# Patient Record
Sex: Male | Born: 1970 | Race: White | Hispanic: No | Marital: Married | Smoking: Never smoker
Health system: Southern US, Community
[De-identification: ages and names within clinical notes are randomized; demographics above are authoritative.]

## PROBLEM LIST (undated history)

## (undated) DIAGNOSIS — B019 Varicella without complication: Secondary | ICD-10-CM

## (undated) DIAGNOSIS — I447 Left bundle-branch block, unspecified: Secondary | ICD-10-CM

## (undated) DIAGNOSIS — T7840XA Allergy, unspecified, initial encounter: Secondary | ICD-10-CM

## (undated) DIAGNOSIS — R51 Headache: Secondary | ICD-10-CM

## (undated) DIAGNOSIS — M1990 Unspecified osteoarthritis, unspecified site: Secondary | ICD-10-CM

## (undated) DIAGNOSIS — I442 Atrioventricular block, complete: Secondary | ICD-10-CM

## (undated) DIAGNOSIS — E785 Hyperlipidemia, unspecified: Secondary | ICD-10-CM

## (undated) DIAGNOSIS — R519 Headache, unspecified: Secondary | ICD-10-CM

## (undated) HISTORY — DX: Atrioventricular block, complete: I44.2

## (undated) HISTORY — PX: WISDOM TOOTH EXTRACTION: SHX21

## (undated) HISTORY — DX: Allergy, unspecified, initial encounter: T78.40XA

## (undated) HISTORY — DX: Headache: R51

## (undated) HISTORY — DX: Unspecified osteoarthritis, unspecified site: M19.90

## (undated) HISTORY — DX: Headache, unspecified: R51.9

## (undated) HISTORY — DX: Varicella without complication: B01.9

## (undated) HISTORY — DX: Hyperlipidemia, unspecified: E78.5

---

## 2001-02-17 HISTORY — PX: WRIST SURGERY: SHX841

## 2001-11-19 ENCOUNTER — Ambulatory Visit (HOSPITAL_BASED_OUTPATIENT_CLINIC_OR_DEPARTMENT_OTHER): Admission: RE | Admit: 2001-11-19 | Discharge: 2001-11-19 | Payer: Self-pay | Admitting: Orthopedic Surgery

## 2010-03-07 ENCOUNTER — Other Ambulatory Visit: Payer: Self-pay | Admitting: Internal Medicine

## 2010-03-07 ENCOUNTER — Ambulatory Visit
Admission: RE | Admit: 2010-03-07 | Discharge: 2010-03-07 | Payer: Self-pay | Source: Home / Self Care | Attending: Internal Medicine | Admitting: Internal Medicine

## 2010-03-07 DIAGNOSIS — M2550 Pain in unspecified joint: Secondary | ICD-10-CM

## 2010-03-07 HISTORY — DX: Pain in unspecified joint: M25.50

## 2010-03-07 LAB — LIPID PANEL
Cholesterol: 170 mg/dL (ref 0–200)
HDL: 50.5 mg/dL (ref >39.00)
LDL Cholesterol: 105 mg/dL — ABNORMAL HIGH (ref 0–99)
Total CHOL/HDL Ratio: 3
Triglycerides: 73 mg/dL (ref 0.0–149.0)
VLDL: 14.6 mg/dL (ref 0.0–40.0)

## 2010-03-07 LAB — CBC WITH DIFFERENTIAL/PLATELET
Basophils Absolute: 0 10*3/uL (ref 0.0–0.1)
Basophils Relative: 0.3 % (ref 0.0–3.0)
Eosinophils Absolute: 0.1 10*3/uL (ref 0.0–0.7)
Eosinophils Relative: 2.1 % (ref 0.0–5.0)
HCT: 45 % (ref 39.0–52.0)
Hemoglobin: 15.4 g/dL (ref 13.0–17.0)
Lymphocytes Relative: 34.5 % (ref 12.0–46.0)
Lymphs Abs: 1.8 10*3/uL (ref 0.7–4.0)
MCHC: 34.4 g/dL (ref 30.0–36.0)
MCV: 97.9 fl (ref 78.0–100.0)
Monocytes Absolute: 0.4 10*3/uL (ref 0.1–1.0)
Monocytes Relative: 8.1 % (ref 3.0–12.0)
Neutro Abs: 2.8 10*3/uL (ref 1.4–7.7)
Neutrophils Relative %: 55 % (ref 43.0–77.0)
Platelets: 177 10*3/uL (ref 150.0–400.0)
RBC: 4.59 Mil/uL (ref 4.22–5.81)
RDW: 12.6 % (ref 11.5–14.6)
WBC: 5.1 10*3/uL (ref 4.5–10.5)

## 2010-03-07 LAB — CONVERTED CEMR LAB
Bilirubin Urine: NEGATIVE
Blood in Urine, dipstick: NEGATIVE
Glucose, Urine, Semiquant: NEGATIVE
Ketones, urine, test strip: NEGATIVE
Nitrite: NEGATIVE
Protein, U semiquant: NEGATIVE
Specific Gravity, Urine: 1.015
Urobilinogen, UA: 0.2
WBC Urine, dipstick: NEGATIVE
pH: 7.5

## 2010-03-07 LAB — BASIC METABOLIC PANEL
BUN: 13 mg/dL (ref 6–23)
CO2: 31 mEq/L (ref 19–32)
Calcium: 9.2 mg/dL (ref 8.4–10.5)
Chloride: 101 mEq/L (ref 96–112)
Creatinine, Ser: 0.9 mg/dL (ref 0.4–1.5)
GFR: 98.9 mL/min (ref >60.00)
Glucose, Bld: 79 mg/dL (ref 70–99)
Potassium: 4.4 mEq/L (ref 3.5–5.1)
Sodium: 137 mEq/L (ref 135–145)

## 2010-03-07 LAB — HEPATIC FUNCTION PANEL
ALT: 16 U/L (ref 0–53)
AST: 19 U/L (ref 0–37)
Albumin: 4.3 g/dL (ref 3.5–5.2)
Alkaline Phosphatase: 47 U/L (ref 39–117)
Bilirubin, Direct: 0.1 mg/dL (ref 0.0–0.3)
Total Bilirubin: 1.2 mg/dL (ref 0.3–1.2)
Total Protein: 7.1 g/dL (ref 6.0–8.3)

## 2010-03-07 LAB — TSH: TSH: 1.39 u[IU]/mL (ref 0.35–5.50)

## 2010-03-21 NOTE — Assessment & Plan Note (Signed)
Summary: BRAND NEW PT/TO EST/PT REQ CPX/COMING IN FASTING/CJR   Vital Signs:  Patient profile:   40 year old male Height:      69.5 inches Weight:      174 pounds BMI:     25.42 Pulse rate:   64 / minute BP sitting:   106 / 74  (left arm)  Vitals Entered By: Kyung Rudd, CMA (March 07, 2010 10:18 AM) CC: NP.Marland KitchenMarland Kitchenpt c/o pain in the arches of both feet that radiates up the leg into the calf since November   CC:  NP.Marland KitchenMarland Kitchenpt c/o pain in the arches of both feet that radiates up the leg into the calf since November.  History of Present Illness: Pt presents to clinic to est primary medical care, cpe and evaluation of chronic arthralgias. Notes several year h/o intermittent bilateral hip/leg pain, knee and shoulder pain without injury or trauma. Denies myalgias or inflammatory changes of joints. S/p rheum evaluation in the past with lab work reportedly unremarkable. Now recent h/o plantar foot pain anterior to calcaneal bone without injury or trauma. Attempted shoe insert without significant improvement.   Preventive Screening-Counseling & Management  Alcohol-Tobacco     Smoking Status: never     Smoking Cessation Counseling: no     Tobacco Counseling: not indicated; no tobacco use  Caffeine-Diet-Exercise     Does Patient Exercise: no      Drug Use:  no.    Problems Prior to Update: 1)  Arthralgia  (ICD-719.40) 2)  Physical Examination  (ICD-V70.0) 3)  Family History Diabetes 1st Degree Relative  (ICD-V18.0)  Past History:  Past medical, surgical, family and social histories (including risk factors) reviewed, and no changes noted (except as noted below).  Family History: Reviewed history and no changes required. Family History of Arthritis Family History Diabetes 1st degree relative Family History High cholesterol Family History Lung cancer Family History of Stroke M 1st degree relative <50 father died of Hodgkins at age 36  Social History: Reviewed history and no changes  required. Married Never Smoked Alcohol use-yes Drug use-no Regular exercise-no 1 childSmoking Status:  never Drug Use:  no Does Patient Exercise:  no  Review of Systems General:  Denies chills, fever, and weakness. Eyes:  Denies eye irritation, eye pain, and red eye. ENT:  Denies ear discharge, earache, and nosebleeds. CV:  Denies chest pain or discomfort, fatigue, near fainting, and shortness of breath with exertion. Resp:  Denies chest discomfort, cough, and shortness of breath. GI:  Denies abdominal pain, bloody stools, change in bowel habits, nausea, and vomiting. MS:  Complains of joint pain; denies joint redness, joint swelling, loss of strength, low back pain, muscle aches, cramps, muscle weakness, and stiffness. Derm:  Denies changes in color of skin, flushing, and rash. Neuro:  Denies headaches, memory loss, poor balance, and weakness. Endo:  Denies excessive thirst, excessive urination, and polyuria. Heme:  Denies abnormal bruising, bleeding, and fevers. Allergy:  Denies hives or rash, itching eyes, and persistent infections.  Physical Exam  General:  Well-developed,well-nourished,in no acute distress; alert,appropriate and cooperative throughout examination Head:  Normocephalic and atraumatic without obvious abnormalities. No apparent alopecia or balding. Eyes:  pupils equal, pupils round, corneas and lenses clear, and no injection.   Ears:  External ear exam shows no significant lesions or deformities.  Otoscopic examination reveals clear canals, tympanic membranes are intact bilaterally without bulging, retraction, inflammation or discharge. Hearing is grossly normal bilaterally. Nose:  External nasal examination shows no deformity or inflammation. Nasal mucosa  are pink and moist without lesions or exudates. Mouth:  Oral mucosa and oropharynx without lesions or exudates.  Teeth in good repair. Neck:  No deformities, masses, or tenderness noted. Lungs:  Normal respiratory  effort, chest expands symmetrically. Lungs are clear to auscultation, no crackles or wheezes. Heart:  Normal rate and regular rhythm. S1 and S2 normal without gallop, murmur, click, rub or other extra sounds. Abdomen:  Bowel sounds positive,abdomen soft and non-tender without masses, organomegaly or hernias noted. Msk:  normal ROM, no joint tenderness, no joint swelling, no joint warmth, no redness over joints, no joint deformities, and no crepitation.  FROM right hip. +pain with flexion and internal rotation. No click. +tenderness ant to left calcaneal bone. FROM ankle/achilles.  Extremities:  No clubbing, cyanosis, edema, or deformity noted with normal full range of motion of all joints.   Neurologic:  alert & oriented X3 and gait normal.   Skin:  turgor normal, color normal, no rashes, and no suspicious lesions.   Cervical Nodes:  No lymphadenopathy noted Psych:  Oriented X3, normally interactive, good eye contact, not anxious appearing, and not depressed appearing.     Impression & Recommendations:  Problem # 1:  PHYSICAL EXAMINATION (ICD-V70.0) Obtain screening labs. Tetanus booster given. Rheum consult second opinion of chronic arthralgias. Attempt dlclofenac as needed with food for possible plantar fasciitis. Followup if no improvement or worsening.  Orders: UA Dipstick w/o Micro (automated)  (81003) Venipuncture (47829) Specimen Handling (56213) TLB-BMP (Basic Metabolic Panel-BMET) (80048-METABOL) TLB-CBC Platelet - w/Differential (85025-CBCD) TLB-Hepatic/Liver Function Pnl (80076-HEPATIC) TLB-TSH (Thyroid Stimulating Hormone) (84443-TSH) TLB-Lipid Panel (80061-LIPID)  Complete Medication List: 1)  Diclofenac Sodium 75 Mg Tbec (Diclofenac sodium) .... One by mouth two times a day x 7days then one by mouth two times a day as needed pain (take with food)  Other Orders: Tdap => 66yrs IM (08657) Admin 1st Vaccine (84696) Rheumatology Referral (Rheumatology)  Patient  Instructions: 1)  Please schedule a follow-up appointment in 1 year for a physical. 2)  BMP prior to visit, ICD-9:v70.0 3)  Hepatic Panel prior to visit, ICD-9:v70.0 4)  Lipid Panel prior to visit, ICD-9:v70.0 5)  TSH prior to visit, ICD-9:v70.0 6)  CBC w/ Diff prior to visit, ICD-9:v70.0 7)  Urine-dip prior to visit, ICD-9:v70.0 Prescriptions: DICLOFENAC SODIUM 75 MG TBEC (DICLOFENAC SODIUM) one by mouth two times a day x 7days then one by mouth two times a day as needed pain (take with food)  #30 x 0   Entered and Authorized by:   Edwyna Perfect MD   Signed by:   Edwyna Perfect MD on 03/07/2010   Method used:   Print then Give to Patient   RxID:   [id number]    Orders Added: 1)  UA Dipstick w/o Micro (automated)  [81003] 2)  Venipuncture [25366] 3)  Specimen Handling [99000] 4)  Tdap => 56yrs IM [90715] 5)  Admin 1st Vaccine [90471] 6)  TLB-BMP (Basic Metabolic Panel-BMET) [80048-METABOL] 7)  TLB-CBC Platelet - w/Differential [85025-CBCD] 8)  TLB-Hepatic/Liver Function Pnl [80076-HEPATIC] 9)  TLB-TSH (Thyroid Stimulating Hormone) [84443-TSH] 10)  TLB-Lipid Panel [80061-LIPID] 11)  Rheumatology Referral [Rheumatology] 12)  New Patient 18-39 years [99385]   Immunizations Administered:  Tetanus Vaccine:    Vaccine Type: Tdap    Site: left deltoid    Mfr: GlaxoSmithKline    Dose: 0.5 ml    Route: IM    Given by: Kyung Rudd, CMA    Exp. Date: 12/07/2011    Lot #: [id number]  Immunizations Administered:  Tetanus Vaccine:    Vaccine Type: Tdap    Site: left deltoid    Mfr: GlaxoSmithKline    Dose: 0.5 ml    Route: IM    Given by: Kyung Rudd, CMA    Exp. Date: 12/07/2011    Lot #: [id number]  Laboratory Results   Urine Tests  Date/Time Recieved: March 07, 2010 1:11 PM  Date/Time Reported: March 07, 2010 1:11 PM   Routine Urinalysis   Color: yellow Appearance: Clear Glucose: negative   (Normal Range: Negative) Bilirubin: negative    (Normal Range: Negative) Ketone: negative   (Normal Range: Negative) Spec. Gravity: 1.015   (Normal Range: 1.003-1.035) Blood: negative   (Normal Range: Negative) pH: 7.5   (Normal Range: 5.0-8.0) Protein: negative   (Normal Range: Negative) Urobilinogen: 0.2   (Normal Range: 0-1) Nitrite: negative   (Normal Range: Negative) Leukocyte Esterace: negative   (Normal Range: Negative)    Comments: Wynona Canes, CMA  March 07, 2010 1:11 PM

## 2010-07-05 NOTE — Op Note (Signed)
NAME:  Joel Lynch, Joel Lynch                         ACCOUNT NO.:  [id number]   MEDICAL RECORD NO.:  [id number]                   PATIENT TYPE:  AMB   LOCATION:  [facility]                                  FACILITY:  [facility]   PHYSICIAN:  Cindee Salt, MD                      DATE OF BIRTH:  [birth year]   DATE OF PROCEDURE:  DATE OF DISCHARGE:                                 OPERATIVE REPORT   PREOPERATIVE DIAGNOSES:  1. Fractured fourth metacarpal, left hand.  2. Distal radius fracture, left wrist.   POSTOPERATIVE DIAGNOSES:  1. Fractured fourth metacarpal, left hand.  2. Distal radius fracture, left wrist.   OPERATION:  Open reduction, internal fixation fourth metacarpal fracture  left hand.   SURGEON:  Cindee Salt, M.D.   FIRST ASSISTANT:  ___________   ANESTHESIA:  General.   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  He has  obvious displacement of the fracture of his finger with angulation and  rotation.  The fracture of his wrist is nondisplaced. He is admitted now for  open reduction and internal fixation of the metacarpal fracture.   PROCEDURE:  The patient was brought to the operating room where a general  anesthetic was carried out without difficulty.  He was prepped and draped  using Betadine scrubbing solution.  The left arm free.  Supine position.  The limb was exsanguinated with an Esmarch bandage.  The tourniquet was  placed high and the arm was inflated to 250 mmHg.  Straight incision was  made over the fourth metacarpal and carried down through subcutaneous  tissue.  Bleeders were electrocauterized.  Neurovascular structures  protected.  Dissection carried down splitting the extensor tendon which had  multiple slips.  The fracture was extremely distal long oblique in nature  terminating just under the articular surface on the ulnar aspect.  With  a  moderate amount of difficulty due the spiral nature of the fracture, it was  able to be reduced, clamped.  X-rays confirmed anatomical alignment.  Four  screws were then inserted.  These were 1.5 mm screws measuring 10, 10, 10,  and 14 mm.  Each was drilled and measured, tapped, overdrilled proximally,  countersunk and placed.  This firmly fixed the fracture is position.  AP,  lateral and oblique x-rays revealed that the screws were off good length  with the fracture anatomically reduced.  The wound was copiously irrigated  with saline.  The periosteum closed with a running 6-0 Chromic.  The  extensor tendon with a running 5-0 Mersilene.  The subcutaneous tissue with  interrupted 4-0 Vicryl.  The skin with a subcuticular 4-0 Monocryl suture.  X-rays of the wrist confirmed no change in position.  Sterile compressive  dressing  and splint was applied after Steri-  Strips. The patient tolerated the procedure well and was taken to the  recovery room for observation in satisfactory condition.  He is discharged  home to return to the [facility] in one week, on Talwin NX  and Keflex.                                               Cindee Salt, MD    GK/MEDQ  D:  11/19/2001  T:  11/22/2001  Job:  [id number]

## 2010-08-02 ENCOUNTER — Other Ambulatory Visit: Payer: Self-pay | Admitting: Internal Medicine

## 2010-08-02 DIAGNOSIS — M25552 Pain in left hip: Secondary | ICD-10-CM

## 2010-08-07 ENCOUNTER — Ambulatory Visit
Admission: RE | Admit: 2010-08-07 | Discharge: 2010-08-07 | Disposition: A | Discharge status: Home / Self Care | Payer: BC Managed Care – PPO | Source: Ambulatory Visit | Attending: Internal Medicine | Admitting: Internal Medicine

## 2010-08-07 DIAGNOSIS — M25551 Pain in right hip: Secondary | ICD-10-CM

## 2012-05-05 ENCOUNTER — Telehealth: Payer: Self-pay | Admitting: Family Medicine

## 2012-05-05 NOTE — Telephone Encounter (Signed)
yes

## 2012-05-05 NOTE — Telephone Encounter (Signed)
appt set/kh 

## 2012-05-05 NOTE — Telephone Encounter (Signed)
Decided not to wait for Dr Tawanna Cooler to return/kh

## 2012-05-05 NOTE — Telephone Encounter (Signed)
Pt used to see Dr Rodena Medin at this location. Has not been seen since 2012. Pt wife sees Dr Tawanna Cooler.  Would like to continue to come to [place].   Insurance: [facility] Pt has had some discomfort in his chest from time to time. Would like to get checked out as soon as there is an opening.  Will you accept as a new patient?

## 2012-05-10 ENCOUNTER — Ambulatory Visit (INDEPENDENT_AMBULATORY_CARE_PROVIDER_SITE_OTHER): Payer: BC Managed Care – PPO | Admitting: Family Medicine

## 2012-05-10 ENCOUNTER — Encounter: Payer: Self-pay | Admitting: Family Medicine

## 2012-05-10 VITALS — BP 100/80 | HR 80 | Temp 98.2°F | Resp 12 | Ht 69.75 in | Wt 185.0 lb

## 2012-05-10 DIAGNOSIS — R079 Chest pain, unspecified: Secondary | ICD-10-CM

## 2012-05-10 LAB — BASIC METABOLIC PANEL
BUN: 14 mg/dL (ref 6–23)
Creatinine, Ser: 0.9 mg/dL (ref 0.4–1.5)
GFR: 97.8 mL/min (ref >60.00)
Glucose, Bld: 85 mg/dL (ref 70–99)
Potassium: 3.7 mEq/L (ref 3.5–5.1)

## 2012-05-10 LAB — LIPID PANEL
Cholesterol: 193 mg/dL (ref 0–200)
HDL: 50.4 mg/dL (ref >39.00)
Triglycerides: 86 mg/dL (ref 0.0–149.0)
VLDL: 17.2 mg/dL (ref 0.0–40.0)

## 2012-05-10 NOTE — Patient Instructions (Addendum)
We will call you regarding stress test. 

## 2012-05-10 NOTE — Progress Notes (Signed)
  Subjective:    Patient ID: Joel Lynch, male    DOB: [birth year], 42 y.o.   MRN: [id number]  HPI Patient seen to reestablish care He has history of apparently early osteoarthritis (mostly hips) and has seen rheumatologist previously. Migraine headaches which are infrequent. No other chronic medical problems.  Seen today with acute concerns one to 2 month history of some chest discomfort. Nonexertional. Location is left substernal. Quality is dull ache with no consistent pattern of radiation. Possible mild left arm pains intermittently Severity is mild. Denies associated dyspnea, nausea, or diaphoresis. Does not exercise consistently. Symptoms have never been exertional. No clear exacerbating factors. No alleviating factors. No history of GERD. Took Tagamet without relief  Denies any recent cough or pleuritic pain. No fever. No appetite or weight changes.  No family history of premature CAD. Patient nonsmoker. No history of diabetes or hypertension.   Review of Systems  Constitutional: Negative for fever, chills, appetite change and unexpected weight change.  HENT: Negative for trouble swallowing.   Respiratory: Negative for cough, shortness of breath and wheezing.   Cardiovascular: Positive for chest pain. Negative for palpitations and leg swelling.  Gastrointestinal: Negative for nausea, vomiting and abdominal pain.  Neurological: Negative for dizziness and syncope.  Hematological: Negative for adenopathy. Does not bruise/bleed easily.       Objective:   Physical Exam  Constitutional: He appears well-developed and well-nourished.  Neck: Neck supple. No thyromegaly present.  Cardiovascular: Normal rate and regular rhythm.   No murmur heard. Pulmonary/Chest: Effort normal and breath sounds normal. No respiratory distress. He has no wheezes. He has no rales.  Chest wall is nontender  Abdominal: Soft. He exhibits no distension and no mass. There is no tenderness. There is  no rebound and no guarding.  Musculoskeletal: He exhibits no edema.  Lymphadenopathy:    He has no cervical adenopathy.          Assessment & Plan:  Chest pain. Unclear etiology. Has never had exertional chest pain but does not do any regular exercise. Overall appears to be fairly low risk for CAD. Lipid status is unknown. Check EKG. Check lipids. Consider exercise stress test to further evaluate.

## 2012-05-11 NOTE — Progress Notes (Signed)
Quick Note:  Attempted to call pt and no vm set up. ______

## 2012-05-12 NOTE — Progress Notes (Signed)
Quick Note:  Labs mailed to pt home, asked him to contact our office to update best phone number ______

## 2012-05-27 ENCOUNTER — Ambulatory Visit (INDEPENDENT_AMBULATORY_CARE_PROVIDER_SITE_OTHER): Payer: BC Managed Care – PPO | Admitting: Physician Assistant

## 2012-05-27 DIAGNOSIS — R079 Chest pain, unspecified: Secondary | ICD-10-CM

## 2012-05-27 NOTE — Procedures (Signed)
Exercise Treadmill Test  Pre-Exercise Testing Evaluation Rhythm: normal sinus  Rate: 84     Test  Exercise Tolerance Test Ordering MD: Evelena Peat MD  Interpreting MD: Tereso Newcomer PA-C  Unique Test No: 1  Treadmill:  1  Indication for ETT: chest pain - rule out ischemia  Contraindication to ETT: No   Stress Modality: exercise - treadmill  Cardiac Imaging Performed: non   Protocol: standard Bruce - maximal  Max BP:  138/61  Max MPHR (bpm):  180 85% MPR (bpm):  153  MPHR obtained (bpm):  181 % MPHR obtained:  101  Reached 85% MPHR (min:sec):   6:42 Total Exercise Time (min-sec):  10:01  Workload in METS:  11.4 Borg Scale: 14  Reason ETT Terminated:  desired heart rate attained    ST Segment Analysis At Rest: normal ST segments - no evidence of significant ST depression With Exercise: no evidence of significant ST depression  Other Information Arrhythmia:  No Angina during ETT:  absent (0) Quality of ETT:  diagnostic  ETT Interpretation:  normal - no evidence of ischemia by ST analysis  Comments: Good exercise tolerance. No chest pain. Equivocal BP response to exercise. No ST-T changes to suggest ischemia.   Recommendations: F/u with Kristian Covey, MD as directed. Luna Glasgow, PA-C  12:23 PM 05/27/2012

## 2012-05-28 NOTE — Progress Notes (Signed)
Quick Note:  Pt informed ______ 

## 2016-04-09 ENCOUNTER — Encounter: Payer: Self-pay | Admitting: Family Medicine

## 2016-04-09 ENCOUNTER — Ambulatory Visit (INDEPENDENT_AMBULATORY_CARE_PROVIDER_SITE_OTHER)
Admission: RE | Admit: 2016-04-09 | Discharge: 2016-04-09 | Disposition: A | Discharge status: Home / Self Care | Payer: BC Managed Care – PPO | Source: Ambulatory Visit | Attending: Family Medicine | Admitting: Family Medicine

## 2016-04-09 ENCOUNTER — Ambulatory Visit (INDEPENDENT_AMBULATORY_CARE_PROVIDER_SITE_OTHER): Payer: BC Managed Care – PPO | Admitting: Family Medicine

## 2016-04-09 VITALS — BP 120/80 | HR 73 | Resp 12 | Ht 69.75 in | Wt 199.2 lb

## 2016-04-09 DIAGNOSIS — M549 Dorsalgia, unspecified: Secondary | ICD-10-CM

## 2016-04-09 DIAGNOSIS — Z0000 Encounter for general adult medical examination without abnormal findings: Secondary | ICD-10-CM

## 2016-04-09 LAB — BASIC METABOLIC PANEL
BUN: 13 mg/dL (ref 6–23)
CO2: 27 mEq/L (ref 19–32)
Calcium: 9.2 mg/dL (ref 8.4–10.5)
Chloride: 105 mEq/L (ref 96–112)
Creatinine, Ser: 1.02 mg/dL (ref 0.40–1.50)
GFR: 84.15 mL/min (ref >60.00)
Glucose, Bld: 81 mg/dL (ref 70–99)
POTASSIUM: 4.4 meq/L (ref 3.5–5.1)
SODIUM: 137 meq/L (ref 135–145)

## 2016-04-09 LAB — CBC
HCT: 45.1 % (ref 39.0–52.0)
Hemoglobin: 15.6 g/dL (ref 13.0–17.0)
MCHC: 34.5 g/dL (ref 30.0–36.0)
MCV: 97.2 fl (ref 78.0–100.0)
PLATELETS: 213 10*3/uL (ref 150.0–400.0)
RBC: 4.64 Mil/uL (ref 4.22–5.81)
RDW: 12.7 % (ref 11.5–15.5)
WBC: 5.2 10*3/uL (ref 4.0–10.5)

## 2016-04-09 MED ORDER — CYCLOBENZAPRINE HCL 10 MG PO TABS: 10.0000 mg | ORAL_TABLET | Freq: Three times a day (TID) | ORAL | 0 refills | Status: AC | PRN

## 2016-04-09 NOTE — Progress Notes (Signed)
Pre visit review using our clinic review tool, if applicable. No additional management support is needed unless otherwise documented below in the visit note. 

## 2016-04-09 NOTE — Patient Instructions (Signed)
A few things to remember from today's visit:   Mid-back pain, acute - Plan: cyclobenzaprine (FLEXERIL) 10 MG tablet, Ambulatory referral to Sports Medicine, CBC, Basic metabolic panel    Back pain is very common in adults.The cause of back pain is rarely dangerous and the pain often gets better over time even with no pharmacologic treatment.  The cause of your back pain may not be known. Some common causes of back pain include: 1. Strain of the muscles or ligaments supporting the spine. 2. Wear and tear (degeneration) of the spinal disks. 3. Arthritis. 4. Direct injury to the back.  For many people, back pain may return. Since back pain is rarely dangerous, most people can learn to manage this condition on their own.  HOME CARE INSTRUCTIONS Watch your back pain for any changes. The following actions may help to lessen any discomfort you are feeling:  1. Remain active. It is stressful on your back to sit or stand in one place for long periods of time. Do not sit, drive, or stand in one place for more than 30 minutes at a time. Take short walks on even surfaces as soon as you are able.Try to increase the length of time you walk each day.  2. Exercise regularly as directed by your health care provider. Exercise helps your back heal faster. It also helps avoid future injury by keeping your muscles strong and flexible.  3. Do not stay in bed.Resting more than 1-2 days can delay your recovery.                                                      4. Pay attention to your body when you bend and lift. The most comfortable positions are those that put less stress on your recovering back.  5.  Always use proper lifting techniques, including: Bending your knees. Keeping the load close to your body. Avoiding twisting.  6. Find a comfortable position to sleep. Use a firm mattress and lie on your side with your knees slightly bent. If you lie on your back, put a pillow under your knees.  7.  Over the counter rubbing medications like Icy Hot or Asper cream with Lidocaine may help without significant side effects.  Acetaminophen and/or Aleve/Ibuprofen can be taken if needed and if not contraindications. Local ice and heat may be alternated to reduce pain and spasms. Also massage and even chiropractor treatment.      Muscle relaxants might or might not help, they cause drowsiness among other    side effects. They could also interact with some of medications you may be already taking (medications for depression/anxiety and some pain medications).   8. Maintain a healthy weight. Excess weight puts extra stress on your back and makes it difficult to maintain good posture.   SEEK MEDICAL CARE IF: worsening pain, associated fever, rash/edema on area, pain going to legs or buttocks, numbness/tingling, night pain, or abnormal weight loss.    SEEK IMMEDIATE MEDICAL CARE IF:  1. You develop new bowel or bladder control problems. 2. You have unusual weakness or numbness in your arms or legs. 3. You develop nausea or vomiting. 4. You develop abdominal pain. 5. You feel faint.     Back Exercises The following exercises strengthen the muscles that help to support the back. They also  help to keep the lower back flexible. Doing these exercises can help to prevent back pain or lessen existing pain. If you have back pain or discomfort, try doing these exercises 2-3 times each day or as told by your health care provider. When the pain goes away, do them once each day, but increase the number of times that you repeat the steps for each exercise (do more repetitions). If you do not have back pain or discomfort, do these exercises once each day or as told by your health care provider.   EXERCISES Single Knee to Chest Repeat these steps 3-5 times for each leg: 5. Lie on your back on a firm bed or the floor with your legs extended. 6. Bring one knee to your chest. Your other leg should stay extended  and in contact with the floor. 7. Hold your knee in place by grabbing your knee or thigh. 8. Pull on your knee until you feel a gentle stretch in your lower back. 9. Hold the stretch for 10-30 seconds. 10. Slowly release and straighten your leg.  Pelvic Tilt Repeat these steps 5-10 times: 2. Lie on your back on a firm bed or the floor with your legs extended. 3. Bend your knees so they are pointing toward the ceiling and your feet are flat on the floor. 4. Tighten your lower abdominal muscles to press your lower back against the floor. This motion will tilt your pelvis so your tailbone points up toward the ceiling instead of pointing to your feet or the floor. 5. With gentle tension and even breathing, hold this position for 5-10 seconds.  Cat-Cow Repeat these steps until your lower back becomes more flexible: 1. Get into a hands-and-knees position on a firm surface. Keep your hands under your shoulders, and keep your knees under your hips. You may place padding under your knees for comfort. 2. Let your head hang down, and point your tailbone toward the floor so your lower back becomes rounded like the back of a cat. 3. Hold this position for 5 seconds. 4. Slowly lift your head and point your tailbone up toward the ceiling so your back forms a sagging arch like the back of a cow. 5. Hold this position for 5 seconds.   Press-Ups Repeat these steps 5-10 times: 6. Lie on your abdomen (face-down) on the floor. 7. Place your palms near your head, about shoulder-width apart. 8. While you keep your back as relaxed as possible and keep your hips on the floor, slowly straighten your arms to raise the top half of your body and lift your shoulders. Do not use your back muscles to raise your upper torso. You may adjust the placement of your hands to make yourself more comfortable. 9. Hold this position for 5 seconds while you keep your back relaxed. 10. Slowly return to lying flat on the  floor.   Bridges Repeat these steps 10 times: 1. Lie on your back on a firm surface. 2. Bend your knees so they are pointing toward the ceiling and your feet are flat on the floor. 3. Tighten your buttocks muscles and lift your buttocks off of the floor until your waist is at almost the same height as your knees. You should feel the muscles working in your buttocks and the back of your thighs. If you do not feel these muscles, slide your feet 1-2 inches farther away from your buttocks. 4. Hold this position for 3-5 seconds. 5. Slowly lower your hips to  the starting position, and allow your buttocks muscles to relax completely. If this exercise is too easy, try doing it with your arms crossed over your chest.     Please be sure medication list is accurate. If a new problem present, please set up appointment sooner than planned today.

## 2016-04-09 NOTE — Progress Notes (Signed)
HPI:   [name] is a 46 y.o. male, who is here today c/o persistent mid back pain.  Former pt of Dr Joel Lynch. According to pt, he was evaluated in a local acute care facility on 03/29/16 and Rx for Diclofenac and Methocarbamol were given. He denies any side effects from these medications but have not helped with pain at all.   States that about 2 weeks ago started with sudden"really bad" back pain.  He denies any recent injury or unusual level of activity.  Pain is not radiated, cannot explain type of pain, 8-9/10 in intensity, it "comes in waves" but has some pain constantly. He denies associated numbness, tingling, urinary incontinence or retention, stool incontinence, or saddle anesthesia. Pain causes nausea.  Exacerbated by prolonged sitting, also when lying down, keeps him from sleep. Alleviated some by stretching and changing position.  No rash or edema on area, fever, chills, or abnormal wt loss.  Prior Hx of back pain: Denies. In 2012 he had bilateral hip MRI because persistent pain, otherwise negative.  OTC medications: Aleve,Ibuprofen 800 mg every hour. He is also taking Rx Diclofenac AC 75 mg bid. Today he has taken all these medications because not improvement.    Review of Systems  Constitutional: Positive for fatigue. Negative for chills, fever and unexpected weight change.  HENT: Negative for mouth sores, nosebleeds, sore throat and trouble swallowing.   Respiratory: Negative for cough, shortness of breath and wheezing.   Gastrointestinal: Negative for abdominal pain, nausea and vomiting.  Endocrine: Negative for polydipsia, polyphagia and polyuria.  Genitourinary: Negative for decreased urine volume, difficulty urinating, dysuria, hematuria and urgency.  Musculoskeletal: Positive for back pain. Negative for arthralgias, gait problem and neck pain.  Skin: Negative for color change and rash.  Neurological: Negative for syncope, weakness, numbness  and headaches.  Hematological: Negative for adenopathy. Does not bruise/bleed easily.  Psychiatric/Behavioral: Positive for sleep disturbance (due to pain). Negative for confusion. The patient is nervous/anxious.       No current outpatient prescriptions on file prior to visit.   No current facility-administered medications on file prior to visit.      Past Medical History:  Diagnosis Date  . Allergy   . Arthritis   . Chicken pox   . Headache(784.0)    No Known Allergies  Family History  Problem Relation Age of Onset  . Hyperlipidemia Mother   . Arthritis Father   . Cancer Father 23    hodgkins  . Stroke Maternal Grandmother   . Arthritis Paternal Grandmother   . Hypertension Paternal Grandmother   . Heart disease Paternal Grandmother     Social History   Social History  . Marital status: Married    Spouse name: N/A  . Number of children: N/A  . Years of education: N/A   Social History Main Topics  . Smoking status: Never Smoker  . Smokeless tobacco: Never Used  . Alcohol use None  . Drug use: Unknown  . Sexual activity: Not Asked   Other Topics Concern  . None   Social History Narrative  . None    Vitals:   04/09/16 0754  BP: 120/80  Pulse: 73  Resp: 12   O2 sat 96% at RA. Body mass index is 28.79 kg/m.   Physical Exam  Nursing note and vitals reviewed. Constitutional: He is oriented to person, place, and time. He appears well-developed. No distress.  HENT:  Head: Atraumatic.  Mouth/Throat: Oropharynx is clear  and moist and mucous membranes are normal.  Eyes: Conjunctivae and EOM are normal.  Cardiovascular: Normal rate and regular rhythm.   No murmur heard. Pulses:      Dorsalis pedis pulses are 2+ on the right side, and 2+ on the left side.  Respiratory: Effort normal and breath sounds normal. No respiratory distress.  GI: Soft. He exhibits no mass. There is no hepatomegaly. There is no tenderness.  Musculoskeletal: He exhibits no  edema.       Cervical back: He exhibits no bony tenderness.       Thoracic back: He exhibits no bony tenderness.       Lumbar back: He exhibits no tenderness and no bony tenderness.  Mild pain upon palpation of right thoracic paraspinal muscle, lower interscapular around T6-.T10. No tenderness upon palpation of cervical or lumbar muscles, no limitation of ROM. No bony tenderness or significant deformity appreciated.  Lymphadenopathy:    He has no cervical adenopathy.       Right: No supraclavicular adenopathy present.       Left: No supraclavicular adenopathy present.  Neurological: He is alert and oriented to person, place, and time. He has normal strength. He displays tremor (noted minimal hand tremor with intension when holding med bottles.). No cranial nerve deficit. Coordination and gait normal.  Skin: Skin is warm. No rash noted. No erythema.  Psychiatric: His mood appears anxious. His affect is blunt.  Poor groomed, poor eye contact.      ASSESSMENT AND PLAN:   Joel Lynch was seen today for establish care.  Diagnoses and all orders for this visit:  Mid-back pain, acute  Explained that pain is most likely muscular and examination does not suggest a serious process. Very anxious and concerned about pain, tried to reassured. Because level of pain he is reporting plain imaging was recommended to evaluate for lytic or acute process. Also lab work was done. NSAID's side effects discussed, recommend with continuing with just one of all 3 NSAID's he is taking and take it as recommended. Stop Methocarbamol and try Flexeril instead. Some side effects discussed. Recommend OTC Lidocaine patches, local heat, and stretching exercises as well as avoiding prolonged sitting. Referral to sport med also placed. Instructed about warning signs.   -     cyclobenzaprine (FLEXERIL) 10 MG tablet; Take 1 tablet (10 mg total) by mouth 3 (three) times daily as needed for muscle spasms. -     Ambulatory  referral to Sports Medicine -     CBC -     Basic metabolic panel -     DG Thoracic Spine 2 View; Future  Healthcare maintenance  Otherwise healthy. Tdap up to date. He can continue following annually.      [name]. Martinique, MD  [facility]. [place] office.

## 2016-04-24 ENCOUNTER — Encounter: Payer: Self-pay | Admitting: Sports Medicine

## 2016-04-24 ENCOUNTER — Ambulatory Visit (INDEPENDENT_AMBULATORY_CARE_PROVIDER_SITE_OTHER): Payer: BC Managed Care – PPO | Admitting: Sports Medicine

## 2016-04-24 DIAGNOSIS — M549 Dorsalgia, unspecified: Secondary | ICD-10-CM | POA: Insufficient documentation

## 2016-04-24 HISTORY — DX: Dorsalgia, unspecified: M54.9

## 2016-04-24 NOTE — Assessment & Plan Note (Signed)
Suspect this is a flare from his mild arthritis. Continue over-the-counter anti-inflammatories and Flexeril at night. Gave exercise program for scapular stabilization.  Reviewed posture exercises. Believe this is a mechanical problem caused by his kyphosis and mild arthritis in the back. Recommend follow up if this is not improved and we'll consider physical therapy. Do not believe an MRI is warranted at this time. Could also consider prednisone in the future if this flares up again.

## 2016-04-24 NOTE — Progress Notes (Signed)
  Joel Lynch - 46 y.o. male MRN [id number]  Date of birth: 05-27-1970  SUBJECTIVE:  Including CC & ROS.  CC: mid back pain  Presents with one-month history of mid back pain. He reports an acute onset and it would come in waves of tense pain and less pain. He was seen at urgent care and given diclofenac methocarbamol. These medications did not help and he was taking NSAIDs every hour. He then follows PCP who recommended not taking NSAIDs every hour and recommended doing diclofenac and Flexeril instead. He has noted improvement since doing that. He has been trying to stretch at home. This has helped. It is exacerbated by prolonged sitting and also wakes him up from sleep. Flexeril does help with this. Denies any numbness or tingling, weakness, bowel or bladder incontinence. 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   ROS: No unexpected weight loss, fever, chills, swelling, instability, muscle pain, numbness/tingling, redness, otherwise see HPI   PMHx - Updated and reviewed.  Contributory factors include: Negative PSHx - Updated and reviewed.  Contributory factors include:  Negative FHx - Updated and reviewed.  Contributory factors include:  Negative Social Hx - Updated and reviewed. 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  PHYSICAL EXAM:  VS: BP:(!) 121/91  HR: bpm  TEMP: ( )  RESP:   HT:5\' 9"  (175.3 cm)   WT:199 lb (90.3 kg)  BMI:29.4 PHYSICAL EXAM: Gen: NAD, alert, cooperative with exam, well-appearing HEENT: clear conjunctiva,  CV:  no edema, capillary refill brisk, normal rate Resp: non-labored Skin: no rashes, normal turgor  Neuro: no gross deficits.  Psych:  alert and oriented  Back Exam:  Inspection: Unremarkable  Palpable tenderness: None. Range of Motion:  Flexion 45 deg; Extension 45 deg;  Side Bending to 45 deg bilaterally; Rotation to 45 deg bilaterally  Leg strength: Quad: 5/5 Hamstring: 5/5 Hip flexor: 5/5 Hip abductors: 5/5  Strength at foot: Plantar-flexion: 5/5 Dorsi-flexion: 5/5 Eversion: 5/5 Inversion: 5/5  Sensory change: Gross sensation intact to all lumbar and sacral dermatomes.  Reflexes: 2+ at both patellar tendons, 2+ at achilles tendons, Babinski's downgoing.  Gait unremarkable. SLR sitting: Negative  XSLR sitting: Negative  FABER: negative.    ASSESSMENT & PLAN:   Non-traumatic mid back pain Suspect this is a flare from his mild arthritis. Continue over-the-counter anti-inflammatories and Flexeril at night. Gave exercise program for scapular stabilization.  Reviewed posture exercises. Believe this is a mechanical problem caused by his kyphosis and mild arthritis in the back. Recommend follow up if this is not improved and we'll consider physical therapy. Do not believe an MRI is warranted at this time. Could also consider prednisone in the future if this flares up again.

## 2016-11-06 ENCOUNTER — Encounter: Payer: Self-pay | Admitting: Family Medicine

## 2016-11-20 LAB — HEPATIC FUNCTION PANEL
ALK PHOS: 42 (ref 25–125)
ALT: 36 (ref 10–40)
AST: 30 (ref 14–40)
Bilirubin, Total: 0.3

## 2016-11-20 LAB — BASIC METABOLIC PANEL
BUN: 17 (ref 4–21)
CREATININE: 0.9 (ref 0.6–1.3)
Glucose: 112
Potassium: 4.3 (ref 3.4–5.3)
SODIUM: 139 (ref 137–147)

## 2016-11-20 LAB — CBC AND DIFFERENTIAL
HCT: 44 (ref 41–53)
Hemoglobin: 15.3 (ref 13.5–17.5)
PLATELETS: 176 (ref 150–399)
WBC: 5.6

## 2016-11-23 NOTE — Progress Notes (Signed)
HPI:   Joel Lynch is a 46 y.o. male, who is here today with his wife  to follow on recent ER visit.   He was seen on 11/20/16 in the [place] because episode of syncope.  According to his wife, she found him on the floor at home, lying on abdomen with straight arms and legs, unconscious,"not for long."  He was confused for about 25-30 min after episode. According to wife, he kept asking what happen, wife explained to him then a few min later he asked again. He remembers having some dizziness right before event. He denies chest pain, palpitations, dyspnea, or nausea. Occipital and bitemporal headache after episode, mild nausea. He does not recall head trauma but had a "sore spot" on left temporal area, no hematoma or deformity.  No associated urine incontinence, bowel incontinence, or tongue trauma. Wife did not notice seizure like activity.   He denies any prior history of syncope. He denies new medication, unusual stress, depression,or anxiety.  Head CT  And blood work otherwise negative. EKG in the ER showed LBBB, his wife if very concerned about this finding. No Hx of heart disease.   He states that he also had a "heart sonogram" in the ER but there is not a report on discharge summery he brought with him today.  He is not driving. Question of seizure disorder, so neurology evaluation was recommended, he does not have an appt yet.    He has been evaluated by cardiologist in the past, stress test in 2014.  For the past couple months he has had intermittent dizzy spells, he can not describe symptoms but states that it is not spinning. He feels like he is looking through a tube, no loss of peripheral vision. "Equilibrium" problems. He denies tinnitus or hearing loss but has had ear pressure sensation. Since syncopal episode he feels the same way he feels after a migraine episodes.  Hx of migraines, headache every couple of months and stable  overall.   Review of Systems  Constitutional: Positive for fatigue. Negative for appetite change and fever.  HENT: Negative for congestion, hearing loss, mouth sores, nosebleeds, sore throat, tinnitus and trouble swallowing.   Eyes: Negative for redness and visual disturbance.  Respiratory: Negative for cough, shortness of breath and wheezing.   Cardiovascular: Negative for chest pain, palpitations and leg swelling.  Gastrointestinal: Negative for abdominal pain, nausea and vomiting.  Endocrine: Negative for cold intolerance, heat intolerance, polydipsia, polyphagia and polyuria.  Genitourinary: Negative for decreased urine volume, dysuria and hematuria.  Musculoskeletal: Negative for gait problem and myalgias.  Skin: Negative for pallor and rash.  Allergic/Immunologic: Positive for environmental allergies.  Neurological: Positive for dizziness and syncope. Negative for tremors, weakness and numbness.  Hematological: Negative for adenopathy. Does not bruise/bleed easily.  Psychiatric/Behavioral: Negative for confusion. The patient is nervous/anxious.     No current outpatient prescriptions on file prior to visit.   No current facility-administered medications on file prior to visit.      Past Medical History:  Diagnosis Date  . Allergy   . Arthritis   . Chicken pox   . Headache(784.0)    No Known Allergies  Social History   Social History  . Marital status: Married    Spouse name: N/A  . Number of children: N/A  . Years of education: N/A   Social History Main Topics  . Smoking status: Never Smoker  . Smokeless tobacco: Never Used  .  Alcohol use None  . Drug use: Unknown  . Sexual activity: Not Asked   Other Topics Concern  . None   Social History Narrative  . None    Vitals:   11/24/16 1119  BP: 120/80  Pulse: 89  Resp: 12  SpO2: 96%   Body mass index is 28.81 kg/m.   Physical Exam  Nursing note and vitals reviewed. Constitutional: He is oriented  to person, place, and time. He appears well-developed and well-nourished. No distress.  HENT:  Head: Normocephalic and atraumatic.  Right Ear: Hearing, tympanic membrane, external ear and ear canal normal.  Left Ear: Hearing, tympanic membrane, external ear and ear canal normal.  Mouth/Throat: Oropharynx is clear and moist and mucous membranes are normal.  Eyes: Pupils are equal, round, and reactive to light. Conjunctivae and EOM are normal.  Fundoscopic exam:      The right eye shows no hemorrhage and no papilledema.       The left eye shows no hemorrhage and no papilledema.  Neck: No JVD present. No tracheal deviation present. No thyroid mass and no thyromegaly present.  Cardiovascular: Normal rate and regular rhythm.   No murmur heard. Pulses:      Dorsalis pedis pulses are 2+ on the right side, and 2+ on the left side.  Respiratory: Effort normal and breath sounds normal. No respiratory distress.  GI: Soft. He exhibits no mass. There is no hepatomegaly. There is no tenderness.  Musculoskeletal: He exhibits no edema or tenderness.       Cervical back: He exhibits normal range of motion, no tenderness and no bony tenderness.  Lymphadenopathy:    He has no cervical adenopathy.  Neurological: He is alert and oriented to person, place, and time. He has normal strength. He displays no tremor. No cranial nerve deficit or sensory deficit. Coordination and gait normal.  Reflex Scores:      Bicep reflexes are 2+ on the right side and 2+ on the left side.      Patellar reflexes are 2+ on the right side and 2+ on the left side. Pronator drift negative. Date: remembers month,year,day of the week, does not remember date of the month.  Skin: Skin is warm. No rash noted. No erythema.  Psychiatric: He has a normal mood and affect. Cognition and memory are normal.  Well groomed, good eye contact.     ASSESSMENT AND PLAN:   Joel Lynch was seen today for hospitalization follow-up.  Diagnoses and  all orders for this visit:  Syncope, unspecified syncope type  Possible etiologies dicussed. Neurologic and cardiac causes to be considered. I recommend not to drive until neuro evaluation. Neuro evaluation will be arranged.  -     Cancel: EKG 12-Lead -     Ambulatory referral to Cardiology -     Ambulatory referral to Neurology  LBBB (left bundle branch block)  We dicussed possible causes. We could not be repeated because technical issues. He is asymptomatic. Instructed about warning signs. Cardiology referral placed.  -     Ambulatory referral to Cardiology  Dizziness, nonspecific  ? Vertigo. Fall precautions discussed. Adequate hydration. Blood work in the ER were normal,so no further work up recommended today.     Jaleeya Mcnelly G. Martinique, MD  [facility]. [place] office.

## 2016-11-24 ENCOUNTER — Encounter: Payer: Self-pay | Admitting: Family Medicine

## 2016-11-24 ENCOUNTER — Encounter: Payer: Self-pay | Admitting: Neurology

## 2016-11-24 ENCOUNTER — Ambulatory Visit (INDEPENDENT_AMBULATORY_CARE_PROVIDER_SITE_OTHER): Payer: BC Managed Care – PPO | Admitting: Family Medicine

## 2016-11-24 VITALS — BP 120/80 | HR 89 | Resp 12 | Ht 69.0 in | Wt 195.1 lb

## 2016-11-24 DIAGNOSIS — R55 Syncope and collapse: Secondary | ICD-10-CM

## 2016-11-24 DIAGNOSIS — I447 Left bundle-branch block, unspecified: Secondary | ICD-10-CM | POA: Diagnosis not present

## 2016-11-24 DIAGNOSIS — R42 Dizziness and giddiness: Secondary | ICD-10-CM | POA: Diagnosis not present

## 2016-11-24 NOTE — Patient Instructions (Signed)
A few things to remember from today's visit:   Syncope, unspecified syncope type - Plan: EKG 12-Lead, Ambulatory referral to Cardiology, Ambulatory referral to Neurology  LBBB (left bundle branch block) - Plan: Ambulatory referral to Cardiology  Fall precautions.  If chest pain, palpitation,or another episode of syncope please seek immediate medical attention.   Please be sure medication list is accurate. If a new problem present, please set up appointment sooner than planned today.

## 2016-11-28 ENCOUNTER — Encounter: Payer: Self-pay | Admitting: Cardiology

## 2016-11-28 ENCOUNTER — Ambulatory Visit: Payer: BC Managed Care – PPO | Admitting: Cardiology

## 2016-11-28 ENCOUNTER — Ambulatory Visit (INDEPENDENT_AMBULATORY_CARE_PROVIDER_SITE_OTHER): Payer: BC Managed Care – PPO | Admitting: Cardiology

## 2016-11-28 VITALS — BP 112/76 | HR 73 | Ht 69.0 in | Wt 197.0 lb

## 2016-11-28 DIAGNOSIS — R55 Syncope and collapse: Secondary | ICD-10-CM

## 2016-11-28 HISTORY — DX: Syncope and collapse: R55

## 2016-11-28 NOTE — Progress Notes (Signed)
Cardiology Office Note:    Date:  11/28/2016   ID:  Joel Lynch, DOB Apr 18, 1970, MRN [id number]  PCP:  Martinique, [name], MD  Cardiologist:  Jenean Lindau, MD   Referring MD: Martinique, [name], MD    ASSESSMENT:    1. Syncope, unspecified syncope type    PLAN:    In order of problems listed above:  1. I reassured the patient about my findings. I told him to keep himself Well hydrated and to take a little extra salt in his diet. He will buy a blood pressure machine and get it checked with his doctors to see if his machine is working right. He'll keep track of his pulse and pressure on a daily basis and bring it back to me in the next visit. Fall precautions were advised. I told him not to drive until he is cleared by his primary care physician. He has a neurology appointment pending. 2. Echocardiogram will be done to assess murmur heard on auscultation. He will have a 48-hour Holter monitor. Patient will have a Lexiscan stress test since his left bundle branch block. 3. One-month follow-up or earlier if he has any concerns.   Medication Adjustments/Labs and Tests Ordered: Current medicines are reviewed at length with the patient today.  Concerns regarding medicines are outlined above.  Orders Placed This Encounter  Procedures  . Holter monitor - 48 hour  . MYOCARDIAL PERFUSION IMAGING  . ECHOCARDIOGRAM COMPLETE   No orders of the defined types were placed in this encounter.    History of Present Illness:    Joel Lynch is a 46 y.o. male who is being seen today for the evaluation of syncope at the request of Martinique, Malka So, MD. Patient is a pleasant 46 year old male. He is a professor at [employer]. He mentions to me that he had a syncopal spell. He tells me that he got up and started walking to the bathroom and fell down. By the time his wife heard him and came to see him he was waking up. Wife mentions to me that he had a short period of disorientation. The patient mentions  to me that before this with a several weeks whenever he sits up or stands up suddenly he has some dizziness and some feeling of blood in his vision. No orthopnea or PND. He is a very active gentleman. At the time of my evaluation is alert awake oriented and in no distress. He denies any chest pain. He was found to have a left bundle branch block on the EKG and that was the reason why he was sent here.  Past Medical History:  Diagnosis Date  . Allergy   . Arthritis   . Chicken pox   . IDPOEUMP(536.1)     Past Surgical History:  Procedure Laterality Date  . WRIST SURGERY  2003   broken hand/wrist    Current Medications: Current Meds  Medication Sig  . diphenhydrAMINE (BENADRYL) 25 MG tablet Take 25 mg by mouth every 6 (six) hours as needed for allergies.  Marland Kitchen ibuprofen (ADVIL,MOTRIN) 800 MG tablet Take 800 mg by mouth every 8 (eight) hours as needed for headache.     Allergies:   Patient has no known allergies.   Social History   Social History  . Marital status: Married    Spouse name: N/A  . Number of children: N/A  . Years of education: N/A   Social History Main Topics  . Smoking status: Never Smoker  .  Smokeless tobacco: Never Used  . Alcohol use Yes     Comment: 3 - 4 times per week   . Drug use: No  . Sexual activity: Not Asked   Other Topics Concern  . None   Social History Narrative  . None     Family History: The patient's family history includes Arthritis in his father and paternal grandmother; Cancer (age of onset: 39) in his father; Heart disease in his paternal grandmother; Hyperlipidemia in his mother; Hypertension in his paternal grandmother; Stroke in his maternal grandmother.  ROS:   Please see the history of present illness.    All other systems reviewed and are negative.  EKGs/Labs/Other Studies Reviewed:    The following studies were reviewed today: I reviewed my findings with the patient at extensive length. Reports of blood work and CT scan  were discussed with the patient at length EKG report was discussed and questions were answered to his satisfaction.   Recent Labs: 11/20/2016: ALT 36; BUN 17; Creatinine 0.9; Hemoglobin 15.3; Platelets 176; Potassium 4.3; Sodium 139  Recent Lipid Panel    Component Value Date/Time   CHOL 193 05/10/2012 1454   TRIG 86.0 05/10/2012 1454   HDL 50.40 05/10/2012 1454   CHOLHDL 4 05/10/2012 1454   VLDL 17.2 05/10/2012 1454   LDLCALC 125 (H) 05/10/2012 1454    Physical Exam:    VS:  BP 112/76 (BP Location: Right Arm, Patient Position: Sitting)   Pulse 73   Ht 5\' 9"  (1.753 m)   Wt 197 lb (89.4 kg)   SpO2 98%   BMI 29.09 kg/m     Wt Readings from Last 3 Encounters:  11/28/16 197 lb (89.4 kg)  11/24/16 195 lb 2 oz (88.5 kg)  04/24/16 199 lb (90.3 kg)     GEN: Patient is in no acute distress HEENT: Normal NECK: No JVD; No carotid bruits LYMPHATICS: No lymphadenopathy CARDIAC: S1 S2 regular, 2/6 systolic murmur at the apex. RESPIRATORY:  Clear to auscultation without rales, wheezing or rhonchi  ABDOMEN: Soft, non-tender, non-distended MUSCULOSKELETAL:  No edema; No deformity  SKIN: Warm and dry NEUROLOGIC:  Alert and oriented x 3 PSYCHIATRIC:  Normal affect    Signed, Jenean Lindau, MD  11/28/2016 10:06 AM    [place]

## 2016-11-28 NOTE — Patient Instructions (Addendum)
Medication Instructions:  Your physician recommends that you continue on your current medications as directed. Please refer to the Current Medication list given to you today.   Labwork: None  Testing/Procedures: Your physician has requested that you have a lexiscan myoview. For further information please visit [email or web address]. Please follow instruction sheet, as given.  Your physician has recommended that you wear a holter monitor. Holter monitors are medical devices that record the heart's electrical activity. Doctors most often use these monitors to diagnose arrhythmias. Arrhythmias are problems with the speed or rhythm of the heartbeat. The monitor is a small, portable device. You can wear one while you do your normal daily activities. This is usually used to diagnose what is causing palpitations/syncope (passing out).  Your physician has requested that you have an echocardiogram. Echocardiography is a painless test that uses sound waves to create images of your heart. It provides your doctor with information about the size and shape of your heart and how well your heart's chambers and valves are working. This procedure takes approximately one hour. There are no restrictions for this procedure.      Follow-Up: 1 month  Any Other Special Instructions Will Be Listed Below (If Applicable).     If you need a refill on your cardiac medications before your next appointment, please call your pharmacy.  Regadenoson injection What is this medicine? REGADENOSON is used to test the heart for coronary artery disease. It is used in patients who can not exercise for their stress test. This medicine may be used for other purposes; ask your health care provider or pharmacist if you have questions. COMMON BRAND NAME(S): Lexiscan What should I tell my health care provider before I take this medicine? They need to know if you have any of these conditions: -heart problems -lung or breathing  disease, like asthma or COPD -an unusual or allergic reaction to regadenoson, other medicines, foods, dyes, or preservatives -pregnant or trying to get pregnant -breast-feeding How should I use this medicine? This medicine is for injection into a vein. It is given by a health care professional in a hospital or clinic setting. Talk to your pediatrician regarding the use of this medicine in children. Special care may be needed. Overdosage: If you think you have taken too much of this medicine contact a poison control center or emergency room at once. NOTE: This medicine is only for you. Do not share this medicine with others. What if I miss a dose? This does not apply. What may interact with this medicine? -caffeine -dipyridamole -guarana -theophylline This list may not describe all possible interactions. Give your health care provider a list of all the medicines, herbs, non-prescription drugs, or dietary supplements you use. Also tell them if you smoke, drink alcohol, or use illegal drugs. Some items may interact with your medicine. What should I watch for while using this medicine? Your condition will be monitored carefully while you are receiving this medicine. Do not take medicines, foods, or drinks with caffeine (like coffee, tea, or colas) for at least 12 hours before your test. If you do not know if something contains caffeine, ask your health care professional. What side effects may I notice from receiving this medicine? Side effects that you should report to your doctor or health care professional as soon as possible: -allergic reactions like skin rash, itching or hives, swelling of the face, lips, or tongue -breathing problems -chest pain, tightness or palpitations -severe headache Side effects that usually do not require medical  attention (report to your doctor or health care professional if they continue or are bothersome): -flushing -headache -irritation or pain at site where  injected -nausea, vomiting This list may not describe all possible side effects. Call your doctor for medical advice about side effects. You may report side effects to FDA at 1-800-FDA-1088. Where should I keep my medicine? This drug is given in a hospital or clinic and will not be stored at home. NOTE: This sheet is a summary. It may not cover all possible information. If you have questions about this medicine, talk to your doctor, pharmacist, or health care provider.  2018 Elsevier/Gold Standard (2007-10-04 15:08:13)  Echocardiogram An echocardiogram, or echocardiography, uses sound waves (ultrasound) to produce an image of your heart. The echocardiogram is simple, painless, obtained within a short period of time, and offers valuable information to your health care provider. The images from an echocardiogram can provide information such as:  Evidence of coronary artery disease (CAD).  Heart size.  Heart muscle function.  Heart valve function.  Aneurysm detection.  Evidence of a past heart attack.  Fluid buildup around the heart.  Heart muscle thickening.  Assess heart valve function.  Tell a health care provider about:  Any allergies you have.  All medicines you are taking, including vitamins, herbs, eye drops, creams, and over-the-counter medicines.  Any problems you or family members have had with anesthetic medicines.  Any blood disorders you have.  Any surgeries you have had.  Any medical conditions you have.  Whether you are pregnant or may be pregnant. What happens before the procedure? No special preparation is needed. Eat and drink normally. What happens during the procedure?  In order to produce an image of your heart, gel will be applied to your chest and a wand-like tool (transducer) will be moved over your chest. The gel will help transmit the sound waves from the transducer. The sound waves will harmlessly bounce off your heart to allow the heart images  to be captured in real-time motion. These images will then be recorded.  You may need an IV to receive a medicine that improves the quality of the pictures. What happens after the procedure? You may return to your normal schedule including diet, activities, and medicines, unless your health care provider tells you otherwise. This information is not intended to replace advice given to you by your health care provider. Make sure you discuss any questions you have with your health care provider. Document Released: 02/01/2000 Document Revised: 09/22/2015 Document Reviewed: 10/11/2012 Elsevier Interactive Patient Education  2017 [employer].

## 2016-12-05 ENCOUNTER — Ambulatory Visit: Payer: BC Managed Care – PPO | Admitting: Interventional Cardiology

## 2016-12-09 ENCOUNTER — Ambulatory Visit (INDEPENDENT_AMBULATORY_CARE_PROVIDER_SITE_OTHER): Payer: BC Managed Care – PPO | Admitting: Neurology

## 2016-12-09 ENCOUNTER — Encounter: Payer: Self-pay | Admitting: Neurology

## 2016-12-09 ENCOUNTER — Telehealth (HOSPITAL_COMMUNITY): Payer: Self-pay | Admitting: *Deleted

## 2016-12-09 VITALS — BP 110/80 | HR 90 | Ht 69.0 in | Wt 195.1 lb

## 2016-12-09 DIAGNOSIS — R55 Syncope and collapse: Secondary | ICD-10-CM | POA: Diagnosis not present

## 2016-12-09 DIAGNOSIS — R404 Transient alteration of awareness: Secondary | ICD-10-CM | POA: Diagnosis not present

## 2016-12-09 NOTE — Progress Notes (Signed)
NEUROLOGY CONSULTATION NOTE  Joel Lynch MRN: [id number] DOB: 1970-10-07  Referring provider: Dr. Betty Martinique Primary care provider: Dr. Betty Martinique  Reason for consult:  syncope  Dear Dr Martinique:  Thank you for your kind referral of Joel Lynch for consultation of the above symptoms. Although his history is well known to you, please allow me to reiterate it for the purpose of our medical record. The patient was accompanied to the clinic by his wife who also provides collateral information. Records and images were personally reviewed where available.  HISTORY OF PRESENT ILLNESS: This is a 46 year old ambidextrous left-hand dominant man with a history of migraines, presenting for vision changes and an episode of syncope last 11/20/16. He reports that for the past 1-1.5 months, he has been having episodes where he becomes dizzy with tunnel vision, he has to hold on for a few seconds, no nausea. On 10/4, he recalls getting out of bed and getting dizzy, feeling the same way, then apparently lost consciousness. His wife found him on the floor lying on his stomach. He started waking up and was moaning, confused, asking repeatedly what had happened. He was back to baseline around 30 minutes later. He was brought to [facility] where he started to regain memory. No tongue bite or incontinence. He had a severe headache and some nausea after. 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 No focal weakness noted. Per PCP notes, head CT and bloodwork were unremarkable. EKG showed LBBB. He has seen cardiology and is scheduled for an echocardiogram and holter monitor.   No prior similar spells in the past. He reports occasional "different smells." He "just zones out," since he was a child, maybe occurring multiple times a day. He would be trying to write or work on something, then realize  he did not do anything and had been staring at the same sentence. He denies any rising epigastric sensation, focal numbness/tingling/weakness, no myoclonic jerks. His wife denies any staring/unresponsive episodes. He has had headaches since childhood, he has migraines every 2-4 months, and regular headaches 2-3 times a week. His migraines are usually throbbing "all over" with nausea. "Regular" headaches are over the frontal region. He has some photo/phonosensitivity. Ibuprofen helps sometimes. He denies any diplopia, dysarthria/dysphagia, neck/back pain, bowel/bladder dysfunction. One of his father's cousins may have had seizures. Otherwise he had a normal birth and early development.  There is no history of febrile convulsions, CNS infections such as meningitis/encephalitis, significant traumatic brain injury, neurosurgical procedures.    PAST MEDICAL HISTORY: Past Medical History:  Diagnosis Date  . Allergy   . Arthritis   . Chicken pox   . Headache(784.0)     PAST SURGICAL HISTORY: Past Surgical History:  Procedure Laterality Date  . WRIST SURGERY  2003   broken hand/wrist    MEDICATIONS: Current Outpatient Prescriptions on File Prior to Visit  Medication Sig Dispense Refill  . diphenhydrAMINE (BENADRYL) 25 MG tablet Take 25 mg by mouth every 6 (six) hours as needed for allergies.    Marland Kitchen ibuprofen (ADVIL,MOTRIN) 800 MG tablet Take 800 mg by mouth every 8 (eight) hours as needed for headache.     No current facility-administered medications on file prior to visit.     ALLERGIES: No Known Allergies  FAMILY HISTORY: Family History  Problem Relation Age of Onset  . Hyperlipidemia  Mother   . Arthritis Father   . Cancer Father 72       hodgkins  . Stroke Maternal Grandmother   . Arthritis Paternal Grandmother   . Hypertension Paternal Grandmother   . Heart disease Paternal Grandmother     SOCIAL HISTORY: Social History   Social History  . Marital status: Married    Spouse  name: N/A  . Number of children: N/A  . Years of education: N/A   Occupational History  . Not on file.   Social History Main Topics  . Smoking status: Never Smoker  . Smokeless tobacco: Never Used  . Alcohol use Yes     Comment: 3 - 4 times per week   . Drug use: No  . Sexual activity: Not on file   Other Topics Concern  . Not on file   Social History Narrative  . No narrative on file    REVIEW OF SYSTEMS: Constitutional: No fevers, chills, or sweats, no generalized fatigue, change in appetite Eyes: No visual changes, double vision, eye pain Ear, nose and throat: No hearing loss, ear pain, nasal congestion, sore throat Cardiovascular: No chest pain, palpitations Respiratory:  No shortness of breath at rest or with exertion, wheezes GastrointestinaI: No nausea, vomiting, diarrhea, abdominal pain, fecal incontinence Genitourinary:  No dysuria, urinary retention or frequency Musculoskeletal:  No neck pain, back pain Integumentary: No rash, pruritus, skin lesions Neurological: as above Psychiatric: No depression, insomnia, anxiety Endocrine: No palpitations, fatigue, diaphoresis, mood swings, change in appetite, change in weight, increased thirst Hematologic/Lymphatic:  No anemia, purpura, petechiae. Allergic/Immunologic: no itchy/runny eyes, nasal congestion, recent allergic reactions, rashes  PHYSICAL EXAM: Vitals:   12/09/16 1233  BP: 110/80  Pulse: 90  SpO2: 97%   General: No acute distress Head:  Normocephalic/atraumatic Eyes: Fundoscopic exam shows bilateral sharp discs, no vessel changes, exudates, or hemorrhages Neck: supple, no paraspinal tenderness, full range of motion Back: No paraspinal tenderness Heart: regular rate and rhythm Lungs: Clear to auscultation bilaterally. Vascular: No carotid bruits. Skin/Extremities: No rash, no edema Neurological Exam: Mental status: alert and oriented to person, place, and time, no dysarthria or aphasia, Fund of  knowledge is appropriate.  Recent and remote memory are intact.  Attention and concentration are normal.    Able to name objects and repeat phrases. Cranial nerves: CN I: not tested CN II: pupils equal, round and reactive to light, visual fields intact, fundi unremarkable. CN III, IV, VI:  full range of motion, no nystagmus, no ptosis CN V: facial sensation intact CN VII: upper and lower face symmetric CN VIII: hearing intact to finger rub CN IX, X: gag intact, uvula midline CN XI: sternocleidomastoid and trapezius muscles intact CN XII: tongue midline Bulk & Tone: normal, no fasciculations. Motor: 5/5 throughout with no pronator drift. Sensation: intact to light touch, cold, pin, vibration and joint position sense.  No extinction to double simultaneous stimulation.  Romberg test negative Deep Tendon Reflexes: +2 throughout, no ankle clonus Plantar responses: downgoing bilaterally Cerebellar: no incoordination on finger to nose, heel to shin. No dysdiadochokinesia Gait: narrow-based and steady, able to tandem walk adequately. Tremor: none  IMPRESSION: This is a 46 year old right-handed man with a history of migraines, presenting with a 1.5 month history of dizziness with vision changes (tunnel vision), then on 11/20/16 he had similar symptoms then lost consciousness. He was confused for around 30 minutes after with severe headache. Symptoms suggestive of presyncope/syncope, less likely seizure. He is also reporting "zoning out."  His neurological exam is normal. Head CT reported as normal. An MRI brain with and without contrast and 1-hour sleep-deprived EEG will be ordered to assess for focal abnormalities that increase risk for recurrent seizures. We discussed differential diagnosis, most likely vasovagal syncope, continue with cardiac workup. We discussed [place] driving laws, he is anxious to resume driving, if no evidence of epilepsy on testing, would hold off on driving for another 3 months,  unless Cardiology says otherwise. Our office will call him with EEG results, if normal, he will follow-up on a prn basis and knows to call for any changes in symptoms.   Thank you for allowing me to participate in the care of this patient. Please do not hesitate to call for any questions or concerns.   Ellouise Newer, M.D.  CC: Dr. Martinique

## 2016-12-09 NOTE — Patient Instructions (Addendum)
1. Schedule 1-hour sleep-deprived EEG 2. Schedule MRI brain with and without contrast  We have sent a referral to [facility] for your MRI and they will call you directly to schedule your appt. They are located at [place]. If you need to contact them directly please call [phone].   3. Increase fluid intake, liberalize salt intake 4. Continue with Cardiology follow-up 5. Hold off on driving for 3 months, monitor symptoms 6. Our office will call you with results, if normal, follow-up on as needed basis

## 2016-12-09 NOTE — Telephone Encounter (Signed)
Left message on voicemail per DPR in reference to upcoming appointment scheduled on 12/12/16 with detailed instructions given per Myocardial Perfusion Study Information Sheet for the test. LM to arrive 15 minutes early, and that it is imperative to arrive on time for appointment to keep from having the test rescheduled. If you need to cancel or reschedule your appointment, please call the office within 24 hours of your appointment. Failure to do so may result in a cancellation of your appointment, and a $50 no show fee. Phone number given for call back for any questions. Kirstie Peri

## 2016-12-10 ENCOUNTER — Ambulatory Visit (INDEPENDENT_AMBULATORY_CARE_PROVIDER_SITE_OTHER): Payer: BC Managed Care – PPO | Admitting: Neurology

## 2016-12-10 DIAGNOSIS — R55 Syncope and collapse: Secondary | ICD-10-CM

## 2016-12-10 DIAGNOSIS — R404 Transient alteration of awareness: Secondary | ICD-10-CM

## 2016-12-12 ENCOUNTER — Ambulatory Visit (INDEPENDENT_AMBULATORY_CARE_PROVIDER_SITE_OTHER): Payer: BC Managed Care – PPO

## 2016-12-12 ENCOUNTER — Ambulatory Visit (HOSPITAL_COMMUNITY): Payer: BC Managed Care – PPO | Attending: Cardiovascular Disease

## 2016-12-12 ENCOUNTER — Other Ambulatory Visit: Payer: Self-pay

## 2016-12-12 ENCOUNTER — Ambulatory Visit (HOSPITAL_BASED_OUTPATIENT_CLINIC_OR_DEPARTMENT_OTHER): Payer: BC Managed Care – PPO

## 2016-12-12 DIAGNOSIS — R55 Syncope and collapse: Secondary | ICD-10-CM

## 2016-12-12 DIAGNOSIS — I447 Left bundle-branch block, unspecified: Secondary | ICD-10-CM | POA: Diagnosis not present

## 2016-12-12 DIAGNOSIS — R9439 Abnormal result of other cardiovascular function study: Secondary | ICD-10-CM | POA: Diagnosis not present

## 2016-12-12 LAB — MYOCARDIAL PERFUSION IMAGING
CHL CUP NUCLEAR SRS: 7
CHL CUP NUCLEAR SSS: 8
CHL CUP RESTING HR STRESS: 56 {beats}/min
CSEPPHR: 105 {beats}/min
LV dias vol: 118 mL (ref 62–150)
LV sys vol: 55 mL
RATE: 0.24
SDS: 1
TID: 0.9

## 2016-12-12 MED ORDER — REGADENOSON 0.4 MG/5ML IV SOLN
0.4000 mg | Freq: Once | INTRAVENOUS | Status: AC
  Administered 2016-12-12: 0.4 mg via INTRAVENOUS

## 2016-12-12 MED ORDER — TECHNETIUM TC 99M TETROFOSMIN IV KIT
32.7000 | PACK | Freq: Once | INTRAVENOUS | Status: AC | PRN
  Administered 2016-12-12: 32.7 via INTRAVENOUS
  Filled 2016-12-12: qty 33

## 2016-12-12 MED ORDER — TECHNETIUM TC 99M TETROFOSMIN IV KIT
10.5000 | PACK | Freq: Once | INTRAVENOUS | Status: AC | PRN
  Administered 2016-12-12: 10.5 via INTRAVENOUS
  Filled 2016-12-12: qty 11

## 2016-12-16 NOTE — Procedures (Signed)
ELECTROENCEPHALOGRAM REPORT  Date of Study: 12/10/2016  Patient's Name: Joel Lynch MRN: [id number] Date of Birth: 12-04-1970  Referring Provider: Dr. Ellouise Newer  Clinical History: This is a 46 year old man with an episode of loss of consciousness and episodes of "zoning out" multiple times a day.  Medications: Benaryl, Advil  Technical Summary: A multichannel digital 1-hour sleep-deprived EEG recording measured by the international 10-20 system with electrodes applied with paste and impedances below 5000 ohms performed in our laboratory with EKG monitoring in an awake and asleep patient.  Hyperventilation and photic stimulation were performed.  The digital EEG was referentially recorded, reformatted, and digitally filtered in a variety of bipolar and referential montages for optimal display.    Description: The patient is awake and asleep during the recording.  During maximal wakefulness, there is a symmetric, medium voltage 9 Hz posterior dominant rhythm that attenuates with eye opening.  The record is symmetric.  During drowsiness and sleep, there is an increase in theta slowing of the background.  Vertex waves and symmetric sleep spindles were seen.  Hyperventilation and photic stimulation did not elicit any abnormalities.  There were no epileptiform discharges or electrographic seizures seen.    EKG lead was unremarkable.  Impression: This 1-hour awake and asleep EEG is normal.    Clinical Correlation: A normal EEG does not exclude a clinical diagnosis of epilepsy.  If further clinical questions remain, prolonged EEG may be helpful.  Clinical correlation is advised.   Ellouise Newer, M.D.

## 2016-12-17 ENCOUNTER — Telehealth: Payer: Self-pay

## 2016-12-17 NOTE — Telephone Encounter (Signed)
Spoke with pt, relaying message below. 

## 2016-12-17 NOTE — Telephone Encounter (Signed)
-----   Message from Cameron Sprang, MD sent at 12/16/2016  4:00 PM EDT ----- Pls let him know the brain wave test was normal, thanks

## 2016-12-18 ENCOUNTER — Encounter: Payer: Self-pay | Admitting: Neurology

## 2016-12-25 ENCOUNTER — Telehealth: Payer: Self-pay

## 2016-12-25 NOTE — Telephone Encounter (Signed)
Left word the patient to call the office.

## 2016-12-25 NOTE — Telephone Encounter (Signed)
Informed patient of his results and confirmed his next appointment.

## 2016-12-29 ENCOUNTER — Ambulatory Visit: Payer: BC Managed Care – PPO | Admitting: Cardiology

## 2016-12-29 ENCOUNTER — Ambulatory Visit (INDEPENDENT_AMBULATORY_CARE_PROVIDER_SITE_OTHER): Payer: BC Managed Care – PPO | Admitting: Cardiology

## 2016-12-29 ENCOUNTER — Encounter: Payer: Self-pay | Admitting: Cardiology

## 2016-12-29 VITALS — BP 102/58 | HR 72 | Ht 69.0 in | Wt 198.0 lb

## 2016-12-29 DIAGNOSIS — I447 Left bundle-branch block, unspecified: Secondary | ICD-10-CM

## 2016-12-29 DIAGNOSIS — R55 Syncope and collapse: Secondary | ICD-10-CM

## 2016-12-29 HISTORY — DX: Left bundle-branch block, unspecified: I44.7

## 2016-12-29 NOTE — Progress Notes (Signed)
Cardiology Office Note:    Date:  12/29/2016   ID:  Joel Lynch, DOB [birth year], MRN [id number]  PCP:  Lynch, Joel G, MD  Cardiologist:  Joel Lindau, MD   Referring MD: Lynch, Joel G, MD    ASSESSMENT:    1. Syncope, unspecified syncope type    PLAN:    In order of problems listed above:  1. Discussed my findings with the patient in extensive length and reassured him and his wife.  I discussed the findings of various testing done including echocardiography stress test and Holter monitoring. 2. Reassured him.  I told him that increasing shortness salt and fluid in his diet has increased his blood pressure to the point that it appears to be within normal limits.  His symptoms have resolved some.  He is still undergoing neurology evaluation.  From a cardiac standpoint his evaluation is complete here.  Episode of 4 beat nonsustained ventricular tachycardia I do not think medicine treating at this time because of his borderline blood pressure and the fact that his systolic function is preserved.  I mentioned to him that we might do more harm going after these asymptomatic arrhythmias and he vocalized understanding. 3. Had multiple questions which were answered to their satisfaction.  Follow-up appointment in 3 months or earlier if they have any concerns.   Medication Adjustments/Labs and Tests Ordered: Current medicines are reviewed at length with the patient today.  Concerns regarding medicines are outlined above.  No orders of the defined types were placed in this encounter.  No orders of the defined types were placed in this encounter.    Chief Complaint  Patient presents with  . Follow-up    still having some of symptoms, but have not "blacked out"     History of Present Illness:    Joel Lynch is a 46 y.o. male.  Patient was evaluated for syncope.  Patient mentions to me that in the previous times his blood pressure he was unable to record with his machine.  I  thought he had symptoms suggestive of postural hypotension the last time I saw him and encouraged her to take extra salt and fluid in the diet.  This seems to have worked as the patient now has brought multiple blood pressure readings which are within normal limits.  Patient mentions to me that he feels better.  Has issues with migraine and also with nausea at times.  He has not had any more syncopal events.  At the time of my evaluation he is alert awake oriented and in no distress.  Past Medical History:  Diagnosis Date  . Allergy   . Arthritis   . Chicken pox   . DQQIWLNL(892.1)     Past Surgical History:  Procedure Laterality Date  . WRIST SURGERY  2003   broken hand/wrist    Current Medications: Current Meds  Medication Sig  . diphenhydrAMINE (BENADRYL) 25 MG tablet Take 25 mg by mouth every 6 (six) hours as needed for allergies.  Marland Kitchen ibuprofen (ADVIL,MOTRIN) 800 MG tablet Take 800 mg by mouth every 8 (eight) hours as needed for headache.     Allergies:   Patient has no known allergies.   Social History   Socioeconomic History  . Marital status: Married    Spouse name: None  . Number of children: 1  . Years of education: PhD  . Highest education level: None  Social Needs  . Financial resource strain: None  . Food insecurity -  worry: None  . Food insecurity - inability: None  . Transportation needs - medical: None  . Transportation needs - non-medical: None  Occupational History  . Occupation: professor of Doctor, hospital: [employer]  Tobacco Use  . Smoking status: Never Smoker  . Smokeless tobacco: Never Used  Substance and Sexual Activity  . Alcohol use: Yes    Comment: 3 - 4 times per week   . Drug use: No  . Sexual activity: None  Other Topics Concern  . None  Social History Narrative   Lives with wife and child in a one story home.  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  Education: Phd     Family History: The patient's family history includes  Arthritis in his father and paternal grandmother; Cancer (age of onset: 21) in his father; Diabetes in his mother; Heart disease in his paternal grandmother; Hyperlipidemia in his mother; Hypertension in his paternal grandmother; Stroke in his maternal grandmother.  ROS:   Please see the history of present illness.    All other systems reviewed and are negative.  EKGs/Labs/Other Studies Reviewed:    The following studies were reviewed today: I discussed the findings of the Holter monitoring, echocardiogram and stress test with the patient had extensive length.  He and his wife had multiple questions which were answered to their satisfaction.  The monitoring also had an episode of 4 beat nonsustained ventricular tachycardia which the patient was asymptomatic and unaware   Recent Labs: 11/20/2016: ALT 36; BUN 17; Creatinine 0.9; Hemoglobin 15.3; Platelets 176; Potassium 4.3; Sodium 139  Recent Lipid Panel    Component Value Date/Time   CHOL 193 05/10/2012 1454   TRIG 86.0 05/10/2012 1454   HDL 50.40 05/10/2012 1454   CHOLHDL 4 05/10/2012 1454   VLDL 17.2 05/10/2012 1454   LDLCALC 125 (H) 05/10/2012 1454    Physical Exam:    VS:  BP (!) 102/58   Pulse 72   Ht 5\' 9"  (1.753 m)   Wt 198 lb (89.8 kg)   SpO2 97%   BMI 29.24 kg/m     Wt Readings from Last 3 Encounters:  12/29/16 198 lb (89.8 kg)  12/09/16 195 lb 2 oz (88.5 kg)  11/28/16 197 lb (89.4 kg)     GEN: Patient is in no acute distress HEENT: Normal NECK: No JVD; No carotid bruits LYMPHATICS: No lymphadenopathy CARDIAC: Hear sounds regular, 2/6 systolic murmur at the apex. RESPIRATORY:  Clear to auscultation without rales, wheezing or rhonchi  ABDOMEN: Soft, non-tender, non-distended MUSCULOSKELETAL:  No edema; No deformity  SKIN: Warm and dry NEUROLOGIC:  Alert and oriented x 3 PSYCHIATRIC:  Normal affect   Signed, Joel Lindau, MD  12/29/2016 10:42 AM    [place]

## 2016-12-29 NOTE — Patient Instructions (Signed)
Medication Instructions:  Your physician recommends that you continue on your current medications as directed. Please refer to the Current Medication list given to you today.  Labwork: None  Testing/Procedures: None  Follow-Up: Your physician recommends that you schedule a follow-up appointment in: 3 months  Any Other Special Instructions Will Be Listed Below (If Applicable).     If you need a refill on your cardiac medications before your next appointment, please call your pharmacy.   [facility]  Danissa Rundle A, RN, BSN  

## 2017-01-23 ENCOUNTER — Encounter: Payer: Self-pay | Admitting: Family Medicine

## 2017-01-23 ENCOUNTER — Ambulatory Visit: Payer: BC Managed Care – PPO | Admitting: Family Medicine

## 2017-01-23 VITALS — BP 112/80 | HR 85 | Temp 98.5°F | Wt 193.1 lb

## 2017-01-23 DIAGNOSIS — J189 Pneumonia, unspecified organism: Secondary | ICD-10-CM | POA: Diagnosis not present

## 2017-01-23 MED ORDER — HYDROCODONE-HOMATROPINE 5-1.5 MG/5ML PO SYRP: 5.0000 mL | ORAL_SOLUTION | Freq: Every evening | ORAL | 0 refills | Status: DC | PRN

## 2017-01-23 NOTE — Progress Notes (Signed)
Subjective:    Patient ID: Joel Lynch, male    DOB: 1970-10-19, 46 y.o.   MRN: [id number]  No chief complaint on file.   HPI Patient was seen today for follow-up of CAP.  Patient was seen last Friday at urgent care for continued cough and unwell feeling.  At [facility] x-rays were done, pt dx'd with pna, given doxycycline 100 mg and pro-air inhaler.  Patient states yesterday he felt like he was "getting worse".  Pt endorsed increased coughing and occasional CP when coughing.  Pt denied fever, rhinorrhea, nausea, vomiting, constipation, diarrhea.  Patient endorses eating and drinking okay.  Pt feels like pro-air inhaler is helping, but now the medicine is not coming out of the inhaler.  Past Medical History:  Diagnosis Date  . Allergy   . Arthritis   . Chicken pox   . Headache(784.0)     No Known Allergies  ROS See review of systems as stated above in HPI.    Objective:    Blood pressure 112/80, pulse 85, temperature 98.5 F (36.9 C), temperature source Oral, weight 193 lb 1.6 oz (87.6 kg), SpO2 95 %.   Gen. Pleasant, well-nourished, in no distress, normal affect, non toxic  HEENT: [place]/AT, face symmetric, no scleral icterus, PERRLA, nares patent without drainage, pharynx without erythema or exudate. Lungs: cough, no accessory muscle use, CTAB, no wheezes or rales Cardiovascular: RRR, no m/r/g, no peripheral edema Musculoskeletal: No deformities, no cyanosis or clubbing, normal tone Neuro:  A&Ox3, CN II-XII intact, normal gait Skin:  Warm, no lesions/ rash   Wt Readings from Last 3 Encounters:  01/23/17 193 lb 1.6 oz (87.6 kg)  12/29/16 198 lb (89.8 kg)  12/09/16 195 lb 2 oz (88.5 kg)    Lab Results  Component Value Date   WBC 5.6 11/20/2016   HGB 15.3 11/20/2016   HCT 44 11/20/2016   PLT 176 11/20/2016   GLUCOSE 81 04/09/2016   CHOL 193 05/10/2012   TRIG 86.0 05/10/2012   HDL 50.40 05/10/2012   LDLCALC 125 (H) 05/10/2012   ALT 36 11/20/2016   AST 30 11/20/2016   NA  139 11/20/2016   K 4.3 11/20/2016   CL 105 04/09/2016   CREATININE 0.9 11/20/2016   BUN 17 11/20/2016   CO2 27 04/09/2016   TSH 1.39 03/07/2010    Assessment/Plan:  Community acquired pneumonia, unspecified laterality  -continue Doxycycline as pt is afebrile, does not appear toxic -pt advised to contact pharmacy about pro-air not working properly.  -Patient also advised to clean mouthpiece with warm water -Plan: HYDROcodone-homatropine (HYCODAN) 5-1.5 MG/5ML syrup  F/u prn with pcp

## 2017-01-23 NOTE — Patient Instructions (Addendum)
Complete the remaining antibiotic you were prescribed.  And medication to help you with your cough at night has been sent to your pharmacy.   Only take this cough syrup at night as it may make you drowsy.  You should contact your pharmacy about your pro-air inhaler not functioning properly.  If you start to feel worse i.e. fever, nausea, vomiting, chest pain please proceed to your nearest emergency department or contact the clinic.   Community-Acquired Pneumonia, Adult Pneumonia is an infection of the lungs. One type of pneumonia can happen while a person is in a hospital. A different type can happen when a person is not in a hospital (community-acquired pneumonia). It is easy for this kind to spread from person to person. It can spread to you if you breathe near an infected person who coughs or sneezes. Some symptoms include:  A dry cough.  A wet (productive) cough.  Fever.  Sweating.  Chest pain.  Follow these instructions at home:  Take over-the-counter and prescription medicines only as told by your doctor. ? Only take cough medicine if you are losing sleep. ? If you were prescribed an antibiotic medicine, take it as told by your doctor. Do not stop taking the antibiotic even if you start to feel better.  Sleep with your head and neck raised (elevated). You can do this by putting a few pillows under your head, or you can sleep in a recliner.  Do not use tobacco products. These include cigarettes, chewing tobacco, and e-cigarettes. If you need help quitting, ask your doctor.  Drink enough water to keep your pee (urine) clear or pale yellow. A shot (vaccine) can help prevent pneumonia. Shots are often suggested for:  People older than 45 years of age.  People older than 45 years of age: ? Who are having cancer treatment. ? Who have long-term (chronic) lung disease. ? Who have problems with their body's defense system (immune system).  You may also prevent pneumonia if you  take these actions:  Get the flu (influenza) shot every year.  Go to the dentist as often as told.  Wash your hands often. If soap and water are not available, use hand sanitizer.  Contact a doctor if:  You have a fever.  You lose sleep because your cough medicine does not help. Get help right away if:  You are short of breath and it gets worse.  You have more chest pain.  Your sickness gets worse. This is very serious if: ? You are an older adult. ? Your body's defense system is weak.  You cough up blood. This information is not intended to replace advice given to you by your health care provider. Make sure you discuss any questions you have with your health care provider. Document Released: 07/23/2007 Document Revised: 07/12/2015 Document Reviewed: 05/31/2014 Elsevier Interactive Patient Education  [employer].

## 2017-04-24 ENCOUNTER — Encounter: Payer: Self-pay | Admitting: Cardiology

## 2017-04-24 ENCOUNTER — Ambulatory Visit: Payer: BC Managed Care – PPO | Admitting: Cardiology

## 2017-04-24 VITALS — BP 110/74 | HR 76 | Ht 69.0 in | Wt 200.0 lb

## 2017-04-24 DIAGNOSIS — R55 Syncope and collapse: Secondary | ICD-10-CM | POA: Diagnosis not present

## 2017-04-24 NOTE — Progress Notes (Signed)
Cardiology Office Note:    Date:  04/24/2017   ID:  Joel MORREN, DOB [birth year], MRN [id number]  PCP:  Martinique, [name], MD  Cardiologist:  Jenean Lindau, MD   Referring MD: Martinique, [name], MD    ASSESSMENT:    1. Syncope, unspecified syncope type    PLAN:    In order of problems listed above:  1. I reassured the patient about my findings.  Results of stress test and echocardiogram was discussed with him at length and he vocalized understanding.  I advised him to keep himself well-hydrated and he agrees.  He is doing very well and is happy about it.  Primary prevention stressed and importance of exercise stressed I told him to be particularly cautious when the weather gets warmer. 2. Patient will be seen in follow-up appointment in 6 months or earlier if the patient has any concerns    Medication Adjustments/Labs and Tests Ordered: Current medicines are reviewed at length with the patient today.  Concerns regarding medicines are outlined above.  No orders of the defined types were placed in this encounter.  No orders of the defined types were placed in this encounter.    Chief Complaint  Patient presents with  . Follow-up     History of Present Illness:    Joel Lynch is a 47 y.o. male the patient was evaluated for syncope-like episodes.  Patient was sent through a battery of testing and he came out fine with this.  He also was encouraged to increase sodium and fluids in his diet and he has done so and has been very successful.  He tells me occasionally when he forgets to take enough fluids he will have recurrence of the symptoms.  Is very happy that he is doing better.  Past Medical History:  Diagnosis Date  . Allergy   . Arthritis   . Chicken pox   . PNTIRWER(154.0)     Past Surgical History:  Procedure Laterality Date  . WRIST SURGERY  2003   broken hand/wrist    Current Medications: Current Meds  Medication Sig  . diphenhydrAMINE (BENADRYL) 25 MG  tablet Take 25 mg by mouth every 6 (six) hours as needed for allergies.  Marland Kitchen ibuprofen (ADVIL,MOTRIN) 800 MG tablet Take 800 mg by mouth every 8 (eight) hours as needed for headache.     Allergies:   Patient has no known allergies.   Social History   Socioeconomic History  . Marital status: Married    Spouse name: None  . Number of children: 1  . Years of education: PhD  . Highest education level: None  Social Needs  . Financial resource strain: None  . Food insecurity - worry: None  . Food insecurity - inability: None  . Transportation needs - medical: None  . Transportation needs - non-medical: None  Occupational History  . Occupation: professor of Doctor, hospital: [employer]  Tobacco Use  . Smoking status: Never Smoker  . Smokeless tobacco: Never Used  Substance and Sexual Activity  . Alcohol use: Yes    Comment: 3 - 4 times per week   . Drug use: No  . Sexual activity: None  Other Topics Concern  . None  Social History Narrative   Lives with wife and child in a one story home.  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  Education: Phd     Family History: The patient's family history includes Arthritis in his father and  paternal grandmother; Cancer (age of onset: 58) in his father; Diabetes in his mother; Heart disease in his paternal grandmother; Hyperlipidemia in his mother; Hypertension in his paternal grandmother; Stroke in his maternal grandmother.  ROS:   Please see the history of present illness.    All other systems reviewed and are negative.  EKGs/Labs/Other Studies Reviewed:    The following studies were reviewed today: I reviewed the results of echocardiogram and stress test with the patient at extensive length and questions were answered to her satisfaction.   Recent Labs: 11/20/2016: ALT 36; BUN 17; Creatinine 0.9; Hemoglobin 15.3; Platelets 176; Potassium 4.3; Sodium 139  Recent Lipid Panel    Component Value Date/Time   CHOL 193 05/10/2012 1454    TRIG 86.0 05/10/2012 1454   HDL 50.40 05/10/2012 1454   CHOLHDL 4 05/10/2012 1454   VLDL 17.2 05/10/2012 1454   LDLCALC 125 (H) 05/10/2012 1454    Physical Exam:    VS:  BP 110/74   Pulse 76   Ht 5\' 9"  (1.753 m)   Wt 200 lb (90.7 kg)   SpO2 96%   BMI 29.53 kg/m     Wt Readings from Last 3 Encounters:  04/24/17 200 lb (90.7 kg)  01/23/17 193 lb 1.6 oz (87.6 kg)  12/29/16 198 lb (89.8 kg)     GEN: Patient is in no acute distress HEENT: Normal NECK: No JVD; No carotid bruits LYMPHATICS: No lymphadenopathy CARDIAC: Hear sounds regular, 2/6 systolic murmur at the apex. RESPIRATORY:  Clear to auscultation without rales, wheezing or rhonchi  ABDOMEN: Soft, non-tender, non-distended MUSCULOSKELETAL:  No edema; No deformity  SKIN: Warm and dry NEUROLOGIC:  Alert and oriented x 3 PSYCHIATRIC:  Normal affect   Signed, Jenean Lindau, MD  04/24/2017 11:27 AM    [place]

## 2017-04-24 NOTE — Patient Instructions (Signed)
Medication Instructions:  Your physician recommends that you continue on your current medications as directed. Please refer to the Current Medication list given to you today.  Labwork: None  Testing/Procedures: None  Follow-Up: Your physician recommends that you schedule a follow-up appointment in: 9 months  Any Other Special Instructions Will Be Listed Below (If Applicable).     If you need a refill on your cardiac medications before your next appointment, please call your pharmacy.   [facility]  Shari Natt A, RN, BSN  

## 2017-08-27 ENCOUNTER — Encounter (HOSPITAL_COMMUNITY): Payer: Self-pay

## 2017-08-27 ENCOUNTER — Emergency Department (HOSPITAL_COMMUNITY): Payer: BC Managed Care – PPO

## 2017-08-27 ENCOUNTER — Telehealth: Payer: Self-pay | Admitting: Cardiology

## 2017-08-27 ENCOUNTER — Other Ambulatory Visit: Payer: Self-pay

## 2017-08-27 ENCOUNTER — Emergency Department (HOSPITAL_COMMUNITY)
Admission: EM | Admit: 2017-08-27 | Discharge: 2017-08-27 | Disposition: A | Discharge status: Home / Self Care | Payer: BC Managed Care – PPO | Attending: Emergency Medicine | Admitting: Emergency Medicine

## 2017-08-27 DIAGNOSIS — R0789 Other chest pain: Secondary | ICD-10-CM | POA: Insufficient documentation

## 2017-08-27 DIAGNOSIS — I447 Left bundle-branch block, unspecified: Secondary | ICD-10-CM | POA: Diagnosis not present

## 2017-08-27 DIAGNOSIS — R0602 Shortness of breath: Secondary | ICD-10-CM | POA: Insufficient documentation

## 2017-08-27 DIAGNOSIS — R42 Dizziness and giddiness: Secondary | ICD-10-CM

## 2017-08-27 HISTORY — DX: Left bundle-branch block, unspecified: I44.7

## 2017-08-27 LAB — CBC
HEMATOCRIT: 47.4 % (ref 39.0–52.0)
HEMOGLOBIN: 15.8 g/dL (ref 13.0–17.0)
MCH: 32.8 pg (ref 26.0–34.0)
MCHC: 33.3 g/dL (ref 30.0–36.0)
MCV: 98.5 fL (ref 78.0–100.0)
Platelets: 194 10*3/uL (ref 150–400)
RBC: 4.81 MIL/uL (ref 4.22–5.81)
RDW: 12.2 % (ref 11.5–15.5)
WBC: 5.5 10*3/uL (ref 4.0–10.5)

## 2017-08-27 LAB — BASIC METABOLIC PANEL
ANION GAP: 8 (ref 5–15)
BUN: 11 mg/dL (ref 6–20)
CO2: 26 mmol/L (ref 22–32)
Calcium: 9.5 mg/dL (ref 8.9–10.3)
Chloride: 104 mmol/L (ref 98–111)
Creatinine, Ser: 0.93 mg/dL (ref 0.61–1.24)
Glucose, Bld: 99 mg/dL (ref 70–99)
POTASSIUM: 4 mmol/L (ref 3.5–5.1)
SODIUM: 138 mmol/L (ref 135–145)

## 2017-08-27 LAB — I-STAT TROPONIN, ED
TROPONIN I, POC: 0 ng/mL (ref 0.00–0.08)
Troponin i, poc: 0 ng/mL (ref 0.00–0.08)

## 2017-08-27 MED ORDER — MECLIZINE HCL 25 MG PO TABS
25.0000 mg | ORAL_TABLET | Freq: Once | ORAL | Status: AC
  Administered 2017-08-27: 25 mg via ORAL
  Filled 2017-08-27: qty 1

## 2017-08-27 MED ORDER — MECLIZINE HCL 25 MG PO TABS: 25.0000 mg | ORAL_TABLET | Freq: Three times a day (TID) | ORAL | 0 refills | Status: DC | PRN

## 2017-08-27 MED ORDER — SODIUM CHLORIDE 0.9 % IV BOLUS
1000.0000 mL | Freq: Once | INTRAVENOUS | Status: AC
  Administered 2017-08-27: 1000 mL via INTRAVENOUS

## 2017-08-27 NOTE — Telephone Encounter (Signed)
Patient reports chest tightness described as a sharp pain that comes and goes for the past few days. Patient rates chest tightness as 4/10 at this time. Patient states his blood pressure has been fluctuating the past few days as well. His most recent blood pressure this morning was 117/87 and his pulse was 73 beats per minute. 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 Patient is also experiencing dizziness when he gets up and while driving. Patient reports shortness of breath at times, at rest and on exertion. After consulting with Dr. Geraldo Pitter, advised patient to go to the closest emergency department to be evaluated. Patient verbalized understanding and states he will go to [facility] since he is already in [place] for the day. No further questions.

## 2017-08-27 NOTE — Telephone Encounter (Signed)
Patient is having BP fluctuations, some mild chest tightness, and dizziness. His appt is not until 23rd.

## 2017-08-27 NOTE — Discharge Instructions (Signed)
Take Meclizine as needed for dizziness as needed. You can stop this if it is not helping. Follow up with Cardiology

## 2017-08-27 NOTE — ED Triage Notes (Signed)
Pt endorses left sided chest pain with dizziness x 2 weeks. Has hx of LBBB. VSS.

## 2017-08-27 NOTE — ED Provider Notes (Signed)
[facility] EMERGENCY DEPARTMENT Provider Note   CSN: [id number] Arrival date & time: 08/27/17  1146     History   Chief Complaint Chief Complaint  Patient presents with  . Irregular Heart Beat  . Chest Pain    HPI Joel Lynch is a 47 y.o. male who presents with dizziness and chest pain.  Past medical history significant for chronic dizziness, history of syncope, history of ventricular tachycardia, left bundle branch block.  Patient states that he had syncopal episode several months ago. He is followed up with neurology and cardiology.  He has had an EEG done which did not show any seizure-like activity.  He has had Holter monitoring which showed a 4 beat run of nonsustained ventricular tachycardia and he has a known left bundle branch block.  He states that over the past 2 to 3 weeks he has had worsening of his dizziness.  It is worse when he moves his head.  He is not treated for vertigo.  He is also had intermittent chest pain and tightness.  It is worse with exertion but sometimes he has it as rest as well and it does not generally get better with rest.  Symptoms seem to be worse when he is out in the heat but he has been trying to stay well-hydrated.  He has shortness of breath with these episodes.  The pain is across his entire chest more on the left side.  Nothing makes it better.  He has not had this before.  He has noted that when he checked his blood pressure it has been reading an irregular rhythm over the past couple of days. He denies lightheadedness, syncope, abdominal pain, nausea, vomiting, diaphoresis, leg swelling. He called his cardiologist who recommended for him to come to the ED. No recent surgery/travel/immobilization, hx of cancer, leg swelling, hemoptysis, prior DVT/PE, or hormone use.  HPI  Past Medical History:  Diagnosis Date  . Allergy   . Arthritis   . Chicken pox   . Headache(784.0)   . Left bundle branch block     Patient Active  Problem List   Diagnosis Date Noted  . Left bundle branch block (LBBB) 12/29/2016  . Syncope 11/28/2016  . Non-traumatic mid back pain 04/24/2016  . ARTHRALGIA 03/07/2010    Past Surgical History:  Procedure Laterality Date  . WRIST SURGERY  2003   broken hand/wrist        Home Medications    Prior to Admission medications   Medication Sig Start Date End Date Taking? Authorizing Provider  diphenhydrAMINE (BENADRYL) 25 MG tablet Take 25 mg by mouth every 6 (six) hours as needed for allergies.    [provider]  ibuprofen (ADVIL,MOTRIN) 800 MG tablet Take 800 mg by mouth every 8 (eight) hours as needed for headache.    [provider]    Family History Family History  Problem Relation Age of Onset  . Hyperlipidemia Mother   . Diabetes Mother   . Arthritis Father   . Cancer Father 36       hodgkins  . Stroke Maternal Grandmother   . Arthritis Paternal Grandmother   . Hypertension Paternal Grandmother   . Heart disease Paternal Grandmother     Social History Social History   Tobacco Use  . Smoking status: Never Smoker  . Smokeless tobacco: Never Used  Substance Use Topics  . Alcohol use: Yes    Comment: 3 - 4 times per week   .  Drug use: No     Allergies   Patient has no known allergies.   Review of Systems Review of Systems  Constitutional: Negative for chills and fever.  Respiratory: Positive for chest tightness and shortness of breath. Negative for cough and wheezing.   Cardiovascular: Positive for chest pain and palpitations. Negative for leg swelling.  Gastrointestinal: Negative for abdominal pain, nausea and vomiting.  Neurological: Negative for syncope and light-headedness.  All other systems reviewed and are negative.    Physical Exam Updated Vital Signs BP 113/83   Pulse 73   Temp 98.6 F (37 C) (Oral)   Resp 15   Ht 5\' 9"  (1.753 m)   Wt 86.2 kg (190 lb)   SpO2 95%   BMI 28.06 kg/m   Physical Exam    Constitutional: He is oriented to person, place, and time. He appears well-developed and well-nourished. No distress.  NAD  HENT:  Head: Normocephalic and atraumatic.  Eyes: Pupils are equal, round, and reactive to light. Conjunctivae are normal. Right eye exhibits no discharge. Left eye exhibits no discharge. No scleral icterus.  Neck: Normal range of motion.  Cardiovascular: Normal rate and regular rhythm.  Pulmonary/Chest: Effort normal and breath sounds normal. No respiratory distress.  Abdominal: Soft. Bowel sounds are normal. He exhibits no distension. There is no tenderness.  Musculoskeletal:  No peripheral edema  Neurological: He is alert and oriented to person, place, and time.  Skin: Skin is warm and dry.  Psychiatric: He has a normal mood and affect. His behavior is normal.  Nursing note and vitals reviewed.    ED Treatments / Results  Labs (all labs ordered are listed, but only abnormal results are displayed) Labs Reviewed  BASIC METABOLIC PANEL  CBC  I-STAT TROPONIN, ED  I-STAT TROPONIN, ED    EKG EKG Interpretation  Date/Time:  Thursday August 27 2017 11:56:00 EDT Ventricular Rate:  80 PR Interval:  146 QRS Duration: 142 QT Interval:  434 QTC Calculation: 500 R Axis:   -7 Text Interpretation:  Normal sinus rhythm Left bundle branch block Abnormal ECG agree. no change from 2018 EKG Reconfirmed by Charlesetta Shanks [phone]) on 08/27/2017 12:01:15 PM Also confirmed by Charlesetta Shanks [phone]), editor Shon Hale [phone])  on 08/27/2017 12:21:10 PM   Radiology Dg Chest 2 View  Result Date: 08/27/2017 CLINICAL DATA:  Chest pain and dizziness EXAM: CHEST - 2 VIEW COMPARISON:  None. FINDINGS: Lungs are clear. Heart size and pulmonary vascularity are normal. No adenopathy. No pneumothorax. No bone lesions. IMPRESSION: No edema or consolidation. Electronically Signed   By: Lowella Grip III M.D.   On: 08/27/2017 12:35    Procedures Procedures (including critical  care time)  Medications Ordered in ED Medications  sodium chloride 0.9 % bolus 1,000 mL (0 mLs Intravenous Stopped 08/27/17 1531)  meclizine (ANTIVERT) tablet 25 mg (25 mg Oral Given 08/27/17 1550)     Initial Impression / Assessment and Plan / ED Course  I have reviewed the triage vital signs and the nursing notes.  Pertinent labs & imaging results that were available during my care of the patient were reviewed by me and considered in my medical decision making (see chart for details).  46 year old male presents with worsening dizziness and chest pain/tightness for several weeks. His vitals are normal. His exam is unremarkable. He has noted that his watch has been reading irregular rhythm over the past couple of days intermittently. Chest pain work up is reassuring. Doubt ACS, PE, pericarditis,  esophageal rupture, tension pneumothorax, aortic dissection, cardiac tamponade. EKG shows LBBB which is unchanged. CXR is negative. Initial and second troponin is 0. Labs are unremarkable. Will give fluids, Meclizine and reassess.  3:48 PM Feels better after fluids. He is PERC negative. HEART score is 2-3. Will try trial of Meclizine and have him f/u with Cardiology.   Final Clinical Impressions(s) / ED Diagnoses   Final diagnoses:  Atypical chest pain  Dizziness    ED Discharge Orders    None       Recardo Evangelist, PA-C 08/27/17 Potter, [place], DO 08/27/17 1841

## 2017-09-08 ENCOUNTER — Encounter: Payer: Self-pay | Admitting: Cardiology

## 2017-09-08 ENCOUNTER — Ambulatory Visit (INDEPENDENT_AMBULATORY_CARE_PROVIDER_SITE_OTHER): Payer: BC Managed Care – PPO | Admitting: Cardiology

## 2017-09-08 VITALS — BP 116/80 | HR 68 | Ht 69.0 in | Wt 200.4 lb

## 2017-09-08 DIAGNOSIS — R002 Palpitations: Secondary | ICD-10-CM | POA: Diagnosis not present

## 2017-09-08 DIAGNOSIS — I447 Left bundle-branch block, unspecified: Secondary | ICD-10-CM

## 2017-09-08 DIAGNOSIS — R55 Syncope and collapse: Secondary | ICD-10-CM | POA: Diagnosis not present

## 2017-09-08 NOTE — Addendum Note (Signed)
Addended by: Mattie Marlin on: 09/08/2017 03:51 PM   Modules accepted: Orders

## 2017-09-08 NOTE — Patient Instructions (Signed)
Medication Instructions:  Your physician recommends that you continue on your current medications as directed. Please refer to the Current Medication list given to you today.  Labwork: Your physician recommends that you have the following labs drawn: TSH  Testing/Procedures: None  Follow-Up: Your physician recommends that you schedule a follow-up appointment in: 6 months  Any Other Special Instructions Will Be Listed Below (If Applicable).     If you need a refill on your cardiac medications before your next appointment, please call your pharmacy.   Bazile Mills, RN, BSN

## 2017-09-08 NOTE — Progress Notes (Signed)
Cardiology Office Note:    Date:  09/08/2017   ID:  Joel Lynch, DOB 11/15/1970, MRN [id number]  PCP:  Martinique, [name], MD  Cardiologist:  Jenean Lindau, MD   Referring MD: Martinique, [name], MD    ASSESSMENT:    1. Left bundle branch block (LBBB)   2. Syncope, unspecified syncope type    PLAN:    In order of problems listed above:  1. I discussed my findings with the patient and he vocalized understanding.  His blood pressure is now stable.  Adequate hydration was reemphasized. 2. I gave him the option of a one-month event monitor and he wants to hold it off at this time.  If his symptoms recur he will get back to [place] to get it done.  3. Patient will be seen in follow-up appointment in 6 months or earlier if the patient has any concerns.    Medication Adjustments/Labs and Tests Ordered: Current medicines are reviewed at length with the patient today.  Concerns regarding medicines are outlined above.  No orders of the defined types were placed in this encounter.  No orders of the defined types were placed in this encounter.    Chief Complaint  Patient presents with  . Irregular Heart Beat  . Fluctuating blood pressure  . Follow-up    post hospitalization      History of Present Illness:    Joel Lynch is a 47 y.o. male.  The patient was evaluated by me for syncope in the past.  He tells me that he has felt much better in the past several months.  He felt better once and went to the emergency room he received IV fluids and felt better.  He tells me that he tries to keep himself well-hydrated.  He is feeling better after the emergency room visit.  No chest pain orthopnea or PND. At the time of my evaluation, the patient is alert awake oriented and in no distress.  Past Medical History:  Diagnosis Date  . Allergy   . Arthritis   . Chicken pox   . Headache(784.0)   . Left bundle branch block     Past Surgical History:  Procedure Laterality Date  . WRIST  SURGERY  2003   broken hand/wrist    Current Medications: No outpatient medications have been marked as taking for the 09/08/17 encounter (Office Visit) with Anandi Abramo, Reita Cliche, MD.     Allergies:   Patient has no known allergies.   Social History   Socioeconomic History  . Marital status: Married    Spouse name: Not on file  . Number of children: 1  . Years of education: PhD  . Highest education level: Not on file  Occupational History  . Occupation: professor of Doctor, hospital: [employer]  Social Needs  . Financial resource strain: Not on file  . Food insecurity:    Worry: Not on file    Inability: Not on file  . Transportation needs:    Medical: Not on file    Non-medical: Not on file  Tobacco Use  . Smoking status: Never Smoker  . Smokeless tobacco: Never Used  Substance and Sexual Activity  . Alcohol use: Yes    Comment: 3 - 4 times per week   . Drug use: No  . Sexual activity: Not on file  Lifestyle  . Physical activity:    Days per week: Not on file    Minutes per  session: Not on file  . Stress: Not on file  Relationships  . Social connections:    Talks on phone: Not on file    Gets together: Not on file    Attends religious service: Not on file    Active member of club or organization: Not on file    Attends meetings of clubs or organizations: Not on file    Relationship status: Not on file  Other Topics Concern  . Not on file  Social History Narrative   Lives with wife and child in a one story home.  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  Education: Phd     Family History: The patient's family history includes Arthritis in his father and paternal grandmother; Cancer (age of onset: 82) in his father; Diabetes in his mother; Heart disease in his paternal grandmother; Hyperlipidemia in his mother; Hypertension in his paternal grandmother; Stroke in his maternal grandmother.  ROS:   Please see the history of present illness.    All other  systems reviewed and are negative.  EKGs/Labs/Other Studies Reviewed:    The following studies were reviewed today: I discussed my findings of the Holter monitor and stress test and echo with the patient at length.  Primary prevention stressed with the patient.  Importance of compliance with diet and medications stressed   Recent Labs: 11/20/2016: ALT 36 08/27/2017: BUN 11; Creatinine, Ser 0.93; Hemoglobin 15.8; Platelets 194; Potassium 4.0; Sodium 138  Recent Lipid Panel    Component Value Date/Time   CHOL 193 05/10/2012 1454   TRIG 86.0 05/10/2012 1454   HDL 50.40 05/10/2012 1454   CHOLHDL 4 05/10/2012 1454   VLDL 17.2 05/10/2012 1454   LDLCALC 125 (H) 05/10/2012 1454    Physical Exam:    VS:  BP 116/80 (BP Location: Left Arm, Patient Position: Sitting)   Pulse 68   Ht 5\' 9"  (1.753 m)   Wt 200 lb 6.4 oz (90.9 kg)   SpO2 96%   BMI 29.59 kg/m     Wt Readings from Last 3 Encounters:  09/08/17 200 lb 6.4 oz (90.9 kg)  08/27/17 190 lb (86.2 kg)  04/24/17 200 lb (90.7 kg)     GEN: Patient is in no acute distress HEENT: Normal NECK: No JVD; No carotid bruits LYMPHATICS: No lymphadenopathy CARDIAC: Hear sounds regular, 2/6 systolic murmur at the apex. RESPIRATORY:  Clear to auscultation without rales, wheezing or rhonchi  ABDOMEN: Soft, non-tender, non-distended MUSCULOSKELETAL:  No edema; No deformity  SKIN: Warm and dry NEUROLOGIC:  Alert and oriented x 3 PSYCHIATRIC:  Normal affect   Signed, Jenean Lindau, MD  09/08/2017 3:44 PM    [facility]

## 2017-09-09 LAB — TSH: TSH: 2.75 u[IU]/mL (ref 0.450–4.500)

## 2018-12-18 ENCOUNTER — Ambulatory Visit (INDEPENDENT_AMBULATORY_CARE_PROVIDER_SITE_OTHER): Payer: BC Managed Care – PPO

## 2018-12-18 ENCOUNTER — Other Ambulatory Visit: Payer: Self-pay | Admitting: Podiatry

## 2018-12-18 ENCOUNTER — Other Ambulatory Visit: Payer: Self-pay

## 2018-12-18 ENCOUNTER — Encounter: Payer: Self-pay | Admitting: Podiatry

## 2018-12-18 ENCOUNTER — Ambulatory Visit: Payer: BC Managed Care – PPO | Admitting: Podiatry

## 2018-12-18 DIAGNOSIS — M722 Plantar fascial fibromatosis: Secondary | ICD-10-CM

## 2018-12-18 DIAGNOSIS — D3610 Benign neoplasm of peripheral nerves and autonomic nervous system, unspecified: Secondary | ICD-10-CM

## 2018-12-18 DIAGNOSIS — M779 Enthesopathy, unspecified: Secondary | ICD-10-CM | POA: Diagnosis not present

## 2018-12-18 DIAGNOSIS — M79672 Pain in left foot: Secondary | ICD-10-CM

## 2018-12-18 DIAGNOSIS — M79671 Pain in right foot: Secondary | ICD-10-CM

## 2018-12-18 DIAGNOSIS — B353 Tinea pedis: Secondary | ICD-10-CM

## 2018-12-18 DIAGNOSIS — L600 Ingrowing nail: Secondary | ICD-10-CM

## 2018-12-18 DIAGNOSIS — G5763 Lesion of plantar nerve, bilateral lower limbs: Secondary | ICD-10-CM

## 2018-12-18 NOTE — Progress Notes (Signed)
  Subjective:  Patient ID: Joel Lynch, male    DOB: 07/29/1970,  MRN: [id number]  Chief Complaint  Patient presents with  . Foot Problem    i have had a lot of feet problems for the last ten years   48 y.o. male presents with the above complaint. Reports pain in right heel and ball of both feet.  States that it feels like the feet are very bony is walking the bones without much padding.  Also complains about ingrown nails to the left third toe   Review of Systems: Negative except as noted in the HPI. Denies N/V/F/Ch.  Past Medical History:  Diagnosis Date  . Allergy   . Arthritis   . Chicken pox   . Headache(784.0)   . Left bundle branch block     Current Outpatient Medications:  .  diphenhydrAMINE (BENADRYL) 25 MG tablet, Take 25 mg by mouth every 6 (six) hours as needed for allergies., Disp: , Rfl:  .  ibuprofen (ADVIL,MOTRIN) 800 MG tablet, Take 800 mg by mouth every 8 (eight) hours as needed for headache., Disp: , Rfl:  .  loratadine (CLARITIN) 10 MG tablet, Take 10 mg by mouth daily as needed for allergies., Disp: , Rfl:   Social History   Tobacco Use  Smoking Status Never Smoker  Smokeless Tobacco Never Used    No Known Allergies Objective:  There were no vitals filed for this visit. There is no height or weight on file to calculate BMI. Constitutional Well developed. Well nourished.  Vascular Dorsalis pedis pulses palpable bilaterally. Posterior tibial pulses palpable bilaterally. Capillary refill normal to all digits.  No cyanosis or clubbing noted. Pedal hair growth normal.  Neurologic Normal speech. Oriented to person, place, and time. Epicritic sensation to light touch grossly present bilaterally.  Dermatologic Nails well groomed and normal in appearance. No open wounds. No skin lesions.  Orthopedic: POP 3rd IS bilat with mulder's click POP R medial calc tuber   Radiographs: taken and reviewed. Decreased IMA central Metatarsals. No acute fractures  or dislocations. Assessment:   1. Capsulitis   2. Plantar fasciitis   3. Morton's neuroma of both feet   4. Pain in both feet   5. Ingrown nail   6. Tinea pedis of both feet    Plan:  Patient was evaluated and treated and all questions answered.  Plantar Fasciitis, bilateral - XR reviewed as above.  - Educated on icing and stretching. Instructions given.  - Injection delivered to the plantar fascia as below. - PF Brace dispensed.  Procedure: Injection Tendon/Ligament Location: Right plantar fascia at the glabrous junction; medial approach. Skin Prep: Alcohol. Injectate: 1 cc 0.5% marcaine plain, 1 cc dexamethasone phosphate, 0.5 cc kenalog 10. Disposition: Patient tolerated procedure well. Injection site dressed with a band-aid.  Interdigital Neuroma, bilaterally -Educated on etiology -Interspace injection delivered as below. -Educated on padding and proper shoegear  Procedure: Neuroma Injection Location: Bilateral 3rd interspace Skin Prep: Alcohol. Injectate: 0.5 cc 0.5% marcaine plain, 0.5 cc dexamethasone phosphate. Disposition: Patient tolerated procedure well. Injection site dressed with a band-aid.  Tinea pedis, onychomycosis -Educated on use a coupon -Left third toenail gently debrided  Return in about 3 weeks (around 01/08/2019) for Neuroma, Plantar fasciitis.

## 2018-12-18 NOTE — Patient Instructions (Addendum)
Fungal Nail Infection A fungal nail infection is a common infection of the toenails or fingernails. This condition affects toenails more often than fingernails. It often affects the great, or big, toes. More than one nail may be infected. The condition can be passed from person to person (is contagious). What are the causes? This condition is caused by a fungus. Several types of fungi can cause the infection. These fungi are common in moist and warm areas. If your hands or feet come into contact with the fungus, it may get into a crack in your fingernail or toenail and cause the infection. What increases the risk? The following factors may make you more likely to develop this condition:  Being male.  Being of older age.  Living with someone who has the fungus.  Walking barefoot in areas where the fungus thrives, such as showers or locker rooms.  Wearing shoes and socks that cause your feet to sweat.  Having a nail injury or a recent nail surgery.  Having certain medical conditions, such as: ? Athlete's foot. ? Diabetes. ? Psoriasis. ? Poor circulation. ? A weak body defense system (immune system). What are the signs or symptoms? Symptoms of this condition include:  A pale spot on the nail.  Thickening of the nail.  A nail that becomes yellow or brown.  A brittle or ragged nail edge.  A crumbling nail.  A nail that has lifted away from the nail bed. How is this diagnosed? This condition is diagnosed with a physical exam. Your health care provider may take a scraping or clipping from your nail to test for the fungus. How is this treated? Treatment is not needed for mild infections. If you have significant nail changes, treatment may include:  Antifungal medicines taken by mouth (orally). You may need to take the medicine for several weeks or several months, and you may not see the results for a long time. These medicines can cause side effects. Ask your health care provider  what problems to watch for.  Antifungal nail polish or nail cream. These may be used along with oral antifungal medicines.  Laser treatment of the nail.  Surgery to remove the nail. This may be needed for the most severe infections. It can take a long time, usually up to a year, for the infection to go away. The infection may also come back. Follow these instructions at home: Medicines  Take or apply over-the-counter and prescription medicines only as told by your health care provider.  Ask your health care provider about using over-the-counter mentholated ointment on your nails. Nail care  Trim your nails often.  Wash and dry your hands and feet every day.  Keep your feet dry: ? Wear absorbent socks, and change your socks frequently. ? Wear shoes that allow air to circulate, such as sandals or canvas tennis shoes. Throw out old shoes.  Do not use artificial nails.  If you go to a nail salon, make sure you choose one that uses clean instruments.  Use antifungal foot powder on your feet and in your shoes. General instructions  Do not share personal items, such as towels or nail clippers.  Do not walk barefoot in shower rooms or locker rooms.  Wear rubber gloves if you are working with your hands in wet areas.  Keep all follow-up visits as told by your health care provider. This is important. Contact a health care provider if: Your infection is not getting better or it is getting worse  after several months. Summary  A fungal nail infection is a common infection of the toenails or fingernails.  Treatment is not needed for mild infections. If you have significant nail changes, treatment may include taking medicine orally and applying medicine to your nails.  It can take a long time, usually up to a year, for the infection to go away. The infection may also come back.  Take or apply over-the-counter and prescription medicines only as told by your health care  provider.  Follow instructions for taking care of your nails to help prevent infection from coming back or spreading. This information is not intended to replace advice given to you by your health care provider. Make sure you discuss any questions you have with your health care provider. Document Released: 02/01/2000 Document Revised: 05/27/2018 Document Reviewed: 07/10/2017 Elsevier Patient Education  [place] Neuralgia  [place] Lathe neuralgia is foot pain that affects the ball of the foot and the area near the toes. Morton neuralgia occurs when part of a nerve in the foot (digital nerve) is under too much pressure (compressed). When this happens over a long period of time, the nerve can thicken (neuroma) and cause pain. Pain usually occurs between the third and fourth toes.  Morton neuralgia can come and go but may get worse over time. What are the causes? This condition is caused by doing the same things over and over with your foot, such as:  Activities such as running or jumping.  Wearing shoes that are too tight. What increases the risk? You may be at higher risk for Morton neuralgia if you:  Are male.  Wear high heels.  Wear shoes that are narrow or tight.  Do activities that repeatedly stretch your toes, such as: ? Running. ? [place]. ? Long-distance walking. What are the signs or symptoms? The first symptom of Morton neuralgia is pain that spreads from the ball of the foot to the toes. It may feel like you are walking on a marble. Pain usually gets worse with walking and goes away at night. Other symptoms may include numbness and cramping of your toes. Both feet are equally affected, but rarely at the same time. How is this diagnosed? This condition is diagnosed based on your symptoms, your medical history, and a physical exam. Your health care provider may:  Squeeze your foot just behind your toe.  Ask you to move your toes to check for pain.  Ask about  your physical activity level. You also may have imaging tests, such as an X-ray, ultrasound, or MRI. How is this treated? Treatment depends on how severe your condition is and what causes it. Treatment may involve:  Wearing different shoes that are not too tight, are low-heeled, and provide good support. For some people, this is the only treatment needed.  Wearing an over-the-counter or custom supportive pad (orthotic) under the front of your foot.  Getting injections of numbing medicine and anti-inflammatory medicine (steroid) in the nerve.  Having surgery to remove part of the thickened nerve. Follow these instructions at home: Managing pain, stiffness, and swelling   Massage your foot as needed.  Wear orthotics as told by your health care provider.  If directed, put ice on your foot: ? Put ice in a plastic bag. ? Place a towel between your skin and the bag. ? Leave the ice on for 20 minutes, 2-3 times a day.  Avoid activities that cause pain or make pain worse. If you play sports, ask  your health care provider when it is safe for you to return to sports.  Raise (elevate) your foot above the level of your heart while lying down and, when possible, while sitting. General instructions  Take over-the-counter and prescription medicines only as told by your health care provider.  Do not drive or use heavy machinery while taking prescription pain medicine.  Wear shoes that: ? Have soft soles. ? Have a wide toe area. ? Provide arch support. ? Do not pinch or squeeze your feet. ? Have room for your orthotics, if applicable.  Keep all follow-up visits as told by your health care provider. This is important. Contact a health care provider if:  Your symptoms get worse or do not get better with treatment and home care. Summary  Morton neuralgia is foot pain that affects the ball of the foot and the area near the toes. Pain usually occurs between the third and fourth toes, gets  worse with walking, and goes away at night.  Morton neuralgia occurs when part of a nerve in the foot (digital nerve) is under too much pressure. When this happens over a long period of time, the nerve can thicken (neuroma) and cause pain.  This condition is caused by doing the same things over and over with your foot, such as running or jumping, wearing shoes that are too tight, or wearing high heels.  Treatment may involve wearing low-heeled shoes that are not too tight, wearing a supportive pad (orthotic) under the front of your foot, getting injections in the nerve, or having surgery to remove part of the thickened nerve. This information is not intended to replace advice given to you by your health care provider. Make sure you discuss any questions you have with your health care provider. Document Released: 05/12/2000 Document Revised: 02/17/2017 Document Reviewed: 02/17/2017 Elsevier Patient Education  [address].   Plantar Fasciitis (Heel Spur Syndrome) with Rehab The plantar fascia is a fibrous, ligament-like, soft-tissue structure that spans the bottom of the foot. Plantar fasciitis is a condition that causes pain in the foot due to inflammation of the tissue. SYMPTOMS   Pain and tenderness on the underneath side of the foot.  Pain that worsens with standing or walking. CAUSES  Plantar fasciitis is caused by irritation and injury to the plantar fascia on the underneath side of the foot. Common mechanisms of injury include:  Direct trauma to bottom of the foot.  Damage to a small nerve that runs under the foot where the main fascia attaches to the heel bone.  Stress placed on the plantar fascia due to bone spurs. RISK INCREASES WITH:   Activities that place stress on the plantar fascia (running, jumping, pivoting, or cutting).  Poor strength and flexibility.  Improperly fitted shoes.  Tight calf muscles.  Flat feet.  Failure to warm-up properly before  activity.  Obesity. PREVENTION  Warm up and stretch properly before activity.  Allow for adequate recovery between workouts.  Maintain physical fitness:  Strength, flexibility, and endurance.  Cardiovascular fitness.  Maintain a health body weight.  Avoid stress on the plantar fascia.  Wear properly fitted shoes, including arch supports for individuals who have flat feet.  PROGNOSIS  If treated properly, then the symptoms of plantar fasciitis usually resolve without surgery. However, occasionally surgery is necessary.  RELATED COMPLICATIONS   Recurrent symptoms that may result in a chronic condition.  Problems of the lower back that are caused by compensating for the injury, such as limping.  Pain or weakness of the foot during push-off following surgery.  Chronic inflammation, scarring, and partial or complete fascia tear, occurring more often from repeated injections.  TREATMENT  Treatment initially involves the use of ice and medication to help reduce pain and inflammation. The use of strengthening and stretching exercises may help reduce pain with activity, especially stretches of the Achilles tendon. These exercises may be performed at home or with a therapist. Your caregiver may recommend that you use heel cups of arch supports to help reduce stress on the plantar fascia. Occasionally, corticosteroid injections are given to reduce inflammation. If symptoms persist for greater than 6 months despite non-surgical (conservative), then surgery may be recommended.   MEDICATION   If pain medication is necessary, then nonsteroidal anti-inflammatory medications, such as aspirin and ibuprofen, or other minor pain relievers, such as acetaminophen, are often recommended.  Do not take pain medication within 7 days before surgery.  Prescription pain relievers may be given if deemed necessary by your caregiver. Use only as directed and only as much as you need.  Corticosteroid  injections may be given by your caregiver. These injections should be reserved for the most serious cases, because they may only be given a certain number of times.  HEAT AND COLD  Cold treatment (icing) relieves pain and reduces inflammation. Cold treatment should be applied for 10 to 15 minutes every 2 to 3 hours for inflammation and pain and immediately after any activity that aggravates your symptoms. Use ice packs or massage the area with a piece of ice (ice massage).  Heat treatment may be used prior to performing the stretching and strengthening activities prescribed by your caregiver, physical therapist, or athletic trainer. Use a heat pack or soak the injury in warm water.  SEEK IMMEDIATE MEDICAL CARE IF:  Treatment seems to offer no benefit, or the condition worsens.  Any medications produce adverse side effects.  EXERCISES- RANGE OF MOTION (ROM) AND STRETCHING EXERCISES - Plantar Fasciitis (Heel Spur Syndrome) These exercises may help you when beginning to rehabilitate your injury. Your symptoms may resolve with or without further involvement from your physician, physical therapist or athletic trainer. While completing these exercises, remember:   Restoring tissue flexibility helps normal motion to return to the joints. This allows healthier, less painful movement and activity.  An effective stretch should be held for at least 30 seconds.  A stretch should never be painful. You should only feel a gentle lengthening or release in the stretched tissue.  RANGE OF MOTION - Toe Extension, Flexion  Sit with your right / left leg crossed over your opposite knee.  Grasp your toes and gently pull them back toward the top of your foot. You should feel a stretch on the bottom of your toes and/or foot.  Hold this stretch for 10 seconds.  Now, gently pull your toes toward the bottom of your foot. You should feel a stretch on the top of your toes and or foot.  Hold this stretch for 10  seconds. Repeat  times. Complete this stretch 3 times per day.   RANGE OF MOTION - Ankle Dorsiflexion, Active Assisted  Remove shoes and sit on a chair that is preferably not on a carpeted surface.  Place right / left foot under knee. Extend your opposite leg for support.  Keeping your heel down, slide your right / left foot back toward the chair until you feel a stretch at your ankle or calf. If you do not feel a stretch,  slide your bottom forward to the edge of the chair, while still keeping your heel down.  Hold this stretch for 10 seconds. Repeat 3 times. Complete this stretch 2 times per day.   STRETCH  Gastroc, Standing  Place hands on wall.  Extend right / left leg, keeping the front knee somewhat bent.  Slightly point your toes inward on your back foot.  Keeping your right / left heel on the floor and your knee straight, shift your weight toward the wall, not allowing your back to arch.  You should feel a gentle stretch in the right / left calf. Hold this position for 10 seconds. Repeat 3 times. Complete this stretch 2 times per day.  STRETCH  Soleus, Standing  Place hands on wall.  Extend right / left leg, keeping the other knee somewhat bent.  Slightly point your toes inward on your back foot.  Keep your right / left heel on the floor, bend your back knee, and slightly shift your weight over the back leg so that you feel a gentle stretch deep in your back calf.  Hold this position for 10 seconds. Repeat 3 times. Complete this stretch 2 times per day.  STRETCH  Gastrocsoleus, Standing  Note: This exercise can place a lot of stress on your foot and ankle. Please complete this exercise only if specifically instructed by your caregiver.   Place the ball of your right / left foot on a step, keeping your other foot firmly on the same step.  Hold on to the wall or a rail for balance.  Slowly lift your other foot, allowing your body weight to press your heel down over  the edge of the step.  You should feel a stretch in your right / left calf.  Hold this position for 10 seconds.  Repeat this exercise with a slight bend in your right / left knee. Repeat 3 times. Complete this stretch 2 times per day.   STRENGTHENING EXERCISES - Plantar Fasciitis (Heel Spur Syndrome)  These exercises may help you when beginning to rehabilitate your injury. They may resolve your symptoms with or without further involvement from your physician, physical therapist or athletic trainer. While completing these exercises, remember:   Muscles can gain both the endurance and the strength needed for everyday activities through controlled exercises.  Complete these exercises as instructed by your physician, physical therapist or athletic trainer. Progress the resistance and repetitions only as guided.  STRENGTH - Towel Curls  Sit in a chair positioned on a non-carpeted surface.  Place your foot on a towel, keeping your heel on the floor.  Pull the towel toward your heel by only curling your toes. Keep your heel on the floor. Repeat 3 times. Complete this exercise 2 times per day.  STRENGTH - Ankle Inversion  Secure one end of a rubber exercise band/tubing to a fixed object (table, pole). Loop the other end around your foot just before your toes.  Place your fists between your knees. This will focus your strengthening at your ankle.  Slowly, pull your big toe up and in, making sure the band/tubing is positioned to resist the entire motion.  Hold this position for 10 seconds.  Have your muscles resist the band/tubing as it slowly pulls your foot back to the starting position. Repeat 3 times. Complete this exercises 2 times per day.  Document Released: 02/03/2005 Document Revised: 04/28/2011 Document Reviewed: 05/18/2008 [facility] Patient Information 2014 [place], [place].

## 2019-01-07 ENCOUNTER — Ambulatory Visit (INDEPENDENT_AMBULATORY_CARE_PROVIDER_SITE_OTHER): Payer: BC Managed Care – PPO | Admitting: Podiatry

## 2019-01-07 ENCOUNTER — Other Ambulatory Visit: Payer: Self-pay

## 2019-01-07 DIAGNOSIS — G5763 Lesion of plantar nerve, bilateral lower limbs: Secondary | ICD-10-CM | POA: Diagnosis not present

## 2019-01-07 DIAGNOSIS — M779 Enthesopathy, unspecified: Secondary | ICD-10-CM

## 2019-01-07 DIAGNOSIS — M722 Plantar fascial fibromatosis: Secondary | ICD-10-CM | POA: Diagnosis not present

## 2019-01-25 NOTE — Progress Notes (Signed)
  Subjective:  Patient ID: Joel Lynch, male    DOB: [birth year],  MRN: [id number]  Chief Complaint  Patient presents with  . Follow-up    BL PF , injections at last visit and the PF brace has helped so much.    48 y.o. male presents with the above complaint.  History above confirmed patient.  States that his feet are feeling much better denies pain today  Review of Systems: Negative except as noted in the HPI. Denies N/V/F/Ch.  Past Medical History:  Diagnosis Date  . Allergy   . Arthritis   . Chicken pox   . Headache(784.0)   . Left bundle branch block     Current Outpatient Medications:  .  diphenhydrAMINE (BENADRYL) 25 MG tablet, Take 25 mg by mouth every 6 (six) hours as needed for allergies., Disp: , Rfl:  .  ibuprofen (ADVIL,MOTRIN) 800 MG tablet, Take 800 mg by mouth every 8 (eight) hours as needed for headache., Disp: , Rfl:  .  loratadine (CLARITIN) 10 MG tablet, Take 10 mg by mouth daily as needed for allergies., Disp: , Rfl:   Social History   Tobacco Use  Smoking Status Never Smoker  Smokeless Tobacco Never Used    No Known Allergies Objective:  There were no vitals filed for this visit. There is no height or weight on file to calculate BMI. Constitutional Well developed. Well nourished.  Vascular Dorsalis pedis pulses palpable bilaterally. Posterior tibial pulses palpable bilaterally. Capillary refill normal to all digits.  No cyanosis or clubbing noted. Pedal hair growth normal.  Neurologic Normal speech. Oriented to person, place, and time. Epicritic sensation to light touch grossly present bilaterally.  Dermatologic Nails well groomed and normal in appearance. No open wounds. No skin lesions.  Orthopedic:  No POP 3rd IS bilat with mulder's click No POP R medial calc tuber   Radiographs: None a Assessment:   No diagnosis found. Plan:  Patient was evaluated and treated and all questions answered.  Plantar Fasciitis, bilateral, Morton's  neuroma bilateral -Improving.  Discussed return should pain persist.  No follow-ups on file.

## 2019-01-28 ENCOUNTER — Other Ambulatory Visit: Payer: Self-pay

## 2019-01-28 ENCOUNTER — Encounter: Payer: Self-pay | Admitting: Cardiology

## 2019-01-28 ENCOUNTER — Ambulatory Visit (INDEPENDENT_AMBULATORY_CARE_PROVIDER_SITE_OTHER): Payer: BC Managed Care – PPO | Admitting: Cardiology

## 2019-01-28 VITALS — BP 110/70 | HR 100 | Ht 69.0 in | Wt 202.4 lb

## 2019-01-28 DIAGNOSIS — I447 Left bundle-branch block, unspecified: Secondary | ICD-10-CM | POA: Diagnosis not present

## 2019-01-28 NOTE — Progress Notes (Signed)
Cardiology Office Note:    Date:  01/28/2019   ID:  Joel Lynch, DOB 01/04/1971, MRN [id number]  PCP:  Martinique, [name], MD  Cardiologist:  Jenean Lindau, MD   Referring MD: Martinique, [name], MD    ASSESSMENT:    1. Left bundle branch block (LBBB)    PLAN:    In order of problems listed above:  1. Left bundle branch block: Asymptomatic.  Patient is aware of this. 2. Low blood pressure: Patient runs low blood pressure especially during summer times.  I encouraged him about extra salt and fluid in the diet.  I told him that if this does not help we can try medication such as Florinef and he is agreeable.  He wants to think about it and get back in touch with me before summer.  He has not had blood work for a long time and we will do complete blood work including screening for primary prevention in the stratification.  He will have lipids checked also. 3. Patient will be seen in follow-up appointment in 9 months or earlier if the patient has any concerns    Medication Adjustments/Labs and Tests Ordered: Current medicines are reviewed at length with the patient today.  Concerns regarding medicines are outlined above.  Orders Placed This Encounter  Procedures  . EKG 12-Lead   No orders of the defined types were placed in this encounter.    Chief Complaint  Patient presents with  . Follow-up     History of Present Illness:    Joel Lynch is a 48 y.o. male.  Patient has past medical history that is not much significant..  He has left bundle branch block.  He denies any problems at this time and takes care of activities of daily living.  No chest pain orthopnea or PND.  At the time of my evaluation, the patient is alert awake oriented and in no distress.  Past Medical History:  Diagnosis Date  . Allergy   . Arthritis   . Chicken pox   . Headache(784.0)   . Left bundle branch block     Past Surgical History:  Procedure Laterality Date  . WRIST SURGERY  2003   broken hand/wrist    Current Medications: Current Meds  Medication Sig  . diphenhydrAMINE (BENADRYL) 25 MG tablet Take 25 mg by mouth every 6 (six) hours as needed for allergies.  Marland Kitchen ibuprofen (ADVIL,MOTRIN) 800 MG tablet Take 800 mg by mouth every 8 (eight) hours as needed for headache.  . loratadine (CLARITIN) 10 MG tablet Take 10 mg by mouth daily as needed for allergies.     Allergies:   Patient has no known allergies.   Social History   Socioeconomic History  . Marital status: Married    Spouse name: Not on file  . Number of children: 1  . Years of education: PhD  . Highest education level: Not on file  Occupational History  . Occupation: professor of Doctor, hospital: [employer]  Tobacco Use  . Smoking status: Never Smoker  . Smokeless tobacco: Never Used  Substance and Sexual Activity  . Alcohol use: Yes    Comment: 3 - 4 times per week   . Drug use: No  . Sexual activity: Not on file  Other Topics Concern  . Not on file  Social History Narrative   Lives with wife and child in a one story home.  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  Education: Phd   Social Determinants of Radio broadcast assistant Strain:   . Difficulty of Paying Living Expenses: Not on file  Food Insecurity:   . Worried About Charity fundraiser in the Last Year: Not on file  . Ran Out of Food in the Last Year: Not on file  Transportation Needs:   . Lack of Transportation (Medical): Not on file  . Lack of Transportation (Non-Medical): Not on file  Physical Activity:   . Days of Exercise per Week: Not on file  . Minutes of Exercise per Session: Not on file  Stress:   . Feeling of Stress : Not on file  Social Connections:   . Frequency of Communication with Friends and Family: Not on file  . Frequency of Social Gatherings with Friends and Family: Not on file  . Attends Religious Services: Not on file  . Active Member of Clubs or Organizations: Not on file  . Attends Theatre manager Meetings: Not on file  . Marital Status: Not on file     Family History: The patient's family history includes Arthritis in his father and paternal grandmother; Cancer (age of onset: 40) in his father; Diabetes in his mother; Heart disease in his paternal grandmother; Hyperlipidemia in his mother; Hypertension in his paternal grandmother; Stroke in his maternal grandmother.  ROS:   Please see the history of present illness.    All other systems reviewed and are negative.  EKGs/Labs/Other Studies Reviewed:    The following studies were reviewed today: EKG reveals sinus rhythm and left bundle branch block.   Recent Labs: No results found for requested labs within last 8760 hours.  Recent Lipid Panel    Component Value Date/Time   CHOL 193 05/10/2012 1454   TRIG 86.0 05/10/2012 1454   HDL 50.40 05/10/2012 1454   CHOLHDL 4 05/10/2012 1454   VLDL 17.2 05/10/2012 1454   LDLCALC 125 (H) 05/10/2012 1454    Physical Exam:    VS:  BP 110/70   Pulse 100   Ht 5\' 9"  (1.753 m)   Wt 202 lb 6.4 oz (91.8 kg)   SpO2 98%   BMI 29.89 kg/m     Wt Readings from Last 3 Encounters:  01/28/19 202 lb 6.4 oz (91.8 kg)  09/08/17 200 lb 6.4 oz (90.9 kg)  08/27/17 190 lb (86.2 kg)     GEN: Patient is in no acute distress HEENT: Normal NECK: No JVD; No carotid bruits LYMPHATICS: No lymphadenopathy CARDIAC: Hear sounds regular, 2/6 systolic murmur at the apex. RESPIRATORY:  Clear to auscultation without rales, wheezing or rhonchi  ABDOMEN: Soft, non-tender, non-distended MUSCULOSKELETAL:  No edema; No deformity  SKIN: Warm and dry NEUROLOGIC:  Alert and oriented x 3 PSYCHIATRIC:  Normal affect   Signed, Jenean Lindau, MD  01/28/2019 1:47 PM    [facility]

## 2019-01-28 NOTE — Patient Instructions (Signed)
Medication Instructions:  Your physician recommends that you continue on your current medications as directed. Please refer to the Current Medication list given to you today.  *If you need a refill on your cardiac medications before your next appointment, please call your pharmacy*  Lab Work: Your physician recommends that you return for lab work in the next few days FASTING: bmp, cbc, tsh, lft, lipids  If you have labs (blood work) drawn today and your tests are completely normal, you will receive your results only by: Marland Kitchen MyChart Message (if you have MyChart) OR . A paper copy in the mail If you have any lab test that is abnormal or we need to change your treatment, we will call you to review the results.  Testing/Procedures: None.   Follow-Up: At [facility], you and your health needs are our priority.  As part of our continuing mission to provide you with exceptional heart care, we have created designated Provider Care Teams.  These Care Teams include your primary Cardiologist (physician) and Advanced Practice Providers (APPs -  Physician Assistants and Nurse Practitioners) who all work together to provide you with the care you need, when you need it.  Your next appointment:   9 month(s)  The format for your next appointment:   In Person  Provider:   Jyl Heinz, MD  Other Instructions

## 2019-01-28 NOTE — Addendum Note (Signed)
Addended by: Ashok Norris on: 01/28/2019 02:20 PM   Modules accepted: Orders

## 2019-02-10 ENCOUNTER — Encounter: Payer: Self-pay | Admitting: *Deleted

## 2019-02-10 LAB — LIPID PANEL
Chol/HDL Ratio: 3.2 ratio (ref 0.0–5.0)
Cholesterol, Total: 184 mg/dL (ref 100–199)
HDL: 57 mg/dL (ref >39)
LDL Chol Calc (NIH): 111 mg/dL — ABNORMAL HIGH (ref 0–99)
Triglycerides: 85 mg/dL (ref 0–149)
VLDL Cholesterol Cal: 16 mg/dL (ref 5–40)

## 2019-02-10 LAB — BASIC METABOLIC PANEL
BUN/Creatinine Ratio: 16 (ref 9–20)
BUN: 14 mg/dL (ref 6–24)
CO2: 20 mmol/L (ref 20–29)
Calcium: 9.3 mg/dL (ref 8.7–10.2)
Chloride: 107 mmol/L — ABNORMAL HIGH (ref 96–106)
Creatinine, Ser: 0.85 mg/dL (ref 0.76–1.27)
GFR calc Af Amer: 120 mL/min/{1.73_m2} (ref >59)
GFR calc non Af Amer: 104 mL/min/{1.73_m2} (ref >59)
Glucose: 91 mg/dL (ref 65–99)
Potassium: 4.4 mmol/L (ref 3.5–5.2)
Sodium: 139 mmol/L (ref 134–144)

## 2019-02-10 LAB — HEPATIC FUNCTION PANEL
ALT: 24 IU/L (ref 0–44)
AST: 20 IU/L (ref 0–40)
Albumin: 4.1 g/dL (ref 4.0–5.0)
Alkaline Phosphatase: 61 IU/L (ref 39–117)
Bilirubin Total: 0.7 mg/dL (ref 0.0–1.2)
Bilirubin, Direct: 0.19 mg/dL (ref 0.00–0.40)
Total Protein: 6.8 g/dL (ref 6.0–8.5)

## 2019-02-10 LAB — CBC
Hematocrit: 45.8 % (ref 37.5–51.0)
Hemoglobin: 16.4 g/dL (ref 13.0–17.7)
MCH: 33.7 pg — ABNORMAL HIGH (ref 26.6–33.0)
MCHC: 35.8 g/dL — ABNORMAL HIGH (ref 31.5–35.7)
MCV: 94 fL (ref 79–97)
Platelets: 205 10*3/uL (ref 150–450)
RBC: 4.87 x10E6/uL (ref 4.14–5.80)
RDW: 11.7 % (ref 11.6–15.4)
WBC: 4.6 10*3/uL (ref 3.4–10.8)

## 2019-02-10 LAB — TSH: TSH: 1.56 u[IU]/mL (ref 0.450–4.500)

## 2019-06-02 ENCOUNTER — Ambulatory Visit: Payer: BC Managed Care – PPO | Admitting: Podiatry

## 2019-06-02 ENCOUNTER — Other Ambulatory Visit: Payer: Self-pay

## 2019-06-02 DIAGNOSIS — M722 Plantar fascial fibromatosis: Secondary | ICD-10-CM | POA: Diagnosis not present

## 2019-06-02 NOTE — Patient Instructions (Signed)

## 2019-06-14 ENCOUNTER — Ambulatory Visit (INDEPENDENT_AMBULATORY_CARE_PROVIDER_SITE_OTHER): Payer: BC Managed Care – PPO | Admitting: Orthotics

## 2019-06-14 ENCOUNTER — Other Ambulatory Visit: Payer: Self-pay

## 2019-06-14 DIAGNOSIS — M722 Plantar fascial fibromatosis: Secondary | ICD-10-CM

## 2019-06-14 DIAGNOSIS — G5763 Lesion of plantar nerve, bilateral lower limbs: Secondary | ICD-10-CM

## 2019-06-14 DIAGNOSIS — M779 Enthesopathy, unspecified: Secondary | ICD-10-CM

## 2019-06-14 NOTE — Progress Notes (Signed)

## 2019-06-23 ENCOUNTER — Other Ambulatory Visit: Payer: Self-pay

## 2019-06-23 ENCOUNTER — Ambulatory Visit: Payer: BC Managed Care – PPO | Admitting: Podiatry

## 2019-06-23 DIAGNOSIS — M722 Plantar fascial fibromatosis: Secondary | ICD-10-CM | POA: Diagnosis not present

## 2019-06-23 DIAGNOSIS — G5762 Lesion of plantar nerve, left lower limb: Secondary | ICD-10-CM | POA: Diagnosis not present

## 2019-06-23 NOTE — Progress Notes (Signed)
  Subjective:  Patient ID: Joel Lynch, male    DOB: [birth year],  MRN: [id number]  Chief Complaint  Patient presents with  . Plantar Fasciitis    Pt states right plantar midfoot/heel is sore.  . Neuroma    Pt states left neuroma is worse.   49 y.o. male presents with the above complaint. History confirmed with patient.   Objective:  Physical Exam: warm, good capillary refill, no trophic changes or ulcerative lesions, normal DP and PT pulses and normal sensory exam. Left Foot: POP 3rd interspace left Right Foot: tenderness to palpation medial calcaneal tuber, no pain with calcaneal squeeze, decreased ankle joint ROM and +Silverskiold test normal exam, no swelling, tenderness, instability; ligaments intact, full range of motion of all ankle/foot joints other than findings noted above.  Assessment:   1. Plantar fasciitis   2. Morton neuroma, left    Plan:  Patient was evaluated and treated and all questions answered.  Plantar Fasciitis -Injection delivered to the plantar fascia of the right foot.  Procedure: Injection Tendon/Ligament Consent: Verbal consent obtained. Location: Right plantar fascia at the glabrous junction; medial approach. Skin Prep: Alcohol. Injectate: 1 cc 0.5% marcaine plain, 1 cc dexamethasone phosphate, 0.5 cc kenalog 10. Disposition: Patient tolerated procedure well. Injection site dressed with a band-aid.  Morton Neuroma -Injection delivered to the affected interspaces  Procedure: Neuroma Injection Location: Left 3rd interspace Skin Prep: Alcohol. Injectate: 0.5 cc 0.5% marcaine plain, 0.5 cc dexamethasone phosphate. Disposition: Patient tolerated procedure well. Injection site dressed with a band-aid.  No follow-ups on file.

## 2019-06-23 NOTE — Progress Notes (Signed)
  Subjective:  Patient ID: Joel Lynch, male    DOB: 1970/07/27,  MRN: [id number]  Chief Complaint  Patient presents with  . Plantar Fasciitis    Pt states flare up began about 2 months ago, is treating with inserts which have helped him.   49 y.o. male presents with the above complaint. History confirmed with patient.  Objective:  Physical Exam: warm, good capillary refill, no trophic changes or ulcerative lesions, normal DP and PT pulses and normal sensory exam. Right Foot: tenderness to palpation medial calcaneal tuber, no pain with calcaneal squeeze, decreased ankle joint ROM and +Silverskiold test normal exam, no swelling, tenderness, instability; ligaments intact, full range of motion of all ankle/foot joints other than findings noted above.  Radiographs: X-ray of the right foot: no evidence of calcaneal stress fracture  Assessment:   1. Plantar fasciitis    Plan:  Patient was evaluated and treated and all questions answered.  Plantar Fasciitis -XR reviewed with patient -Educated patient on stretching and icing of the affected limb -Plantar fascial brace dispensed -Injection delivered to the plantar fascia of the right foot. -Cast for CMOs.  Procedure: Injection Tendon/Ligament Consent: Verbal consent obtained. Location: Right plantar fascia at the glabrous junction; medial approach. Skin Prep: Alcohol. Injectate: 1 cc 0.5% marcaine plain, 1 cc dexamethasone phosphate, 0.5 cc kenalog 10. Disposition: Patient tolerated procedure well. Injection site dressed with a band-aid.  Return in about 3 weeks (around 06/23/2019) for Plantar fasciitis.

## 2019-07-05 ENCOUNTER — Other Ambulatory Visit: Payer: Self-pay

## 2019-07-05 ENCOUNTER — Ambulatory Visit: Payer: BC Managed Care – PPO | Admitting: Orthotics

## 2019-07-05 DIAGNOSIS — M722 Plantar fascial fibromatosis: Secondary | ICD-10-CM

## 2019-07-05 NOTE — Progress Notes (Signed)
Patient came in today to pick up custom made foot orthotics.  The goals were accomplished and the patient reported no dissatisfaction with said orthotics.  Patient was advised of breakin period and how to report any issues. 

## 2019-10-28 ENCOUNTER — Ambulatory Visit: Payer: BC Managed Care – PPO | Admitting: Cardiology

## 2019-10-28 ENCOUNTER — Encounter: Payer: Self-pay | Admitting: Cardiology

## 2019-10-28 ENCOUNTER — Other Ambulatory Visit: Payer: Self-pay

## 2019-10-28 VITALS — BP 114/70 | HR 88 | Ht 69.0 in | Wt 207.4 lb

## 2019-10-28 DIAGNOSIS — E782 Mixed hyperlipidemia: Secondary | ICD-10-CM | POA: Insufficient documentation

## 2019-10-28 DIAGNOSIS — I447 Left bundle-branch block, unspecified: Secondary | ICD-10-CM

## 2019-10-28 HISTORY — DX: Mixed hyperlipidemia: E78.2

## 2019-10-28 NOTE — Patient Instructions (Signed)
Medication Instructions:  Your physician recommends that you continue on your current medications as directed. Please refer to the Current Medication list given to you today.  *If you need a refill on your cardiac medications before your next appointment, please call your pharmacy*   Lab Work: None ordered   If you have labs (blood work) drawn today and your tests are completely normal, you will receive your results only by: . MyChart Message (if you have MyChart) OR . A paper copy in the mail If you have any lab test that is abnormal or we need to change your treatment, we will call you to review the results.   Testing/Procedures: None ordered    Follow-Up: At [facility], you and your health needs are our priority.  As part of our continuing mission to provide you with exceptional heart care, we have created designated Provider Care Teams.  These Care Teams include your primary Cardiologist (physician) and Advanced Practice Providers (APPs -  Physician Assistants and Nurse Practitioners) who all work together to provide you with the care you need, when you need it.  We recommend signing up for the patient portal called "MyChart".  Sign up information is provided on this After Visit Summary.  MyChart is used to connect with patients for Virtual Visits (Telemedicine).  Patients are able to view lab/test results, encounter notes, upcoming appointments, etc.  Non-urgent messages can be sent to your provider as well.   To learn more about what you can do with MyChart, go to [email or web address].    Your next appointment:   6 month(s)  The format for your next appointment:   In Person  Provider:   Rajan Revankar, MD   Other Instructions None   

## 2019-10-28 NOTE — Progress Notes (Signed)
Cardiology Office Note:    Date:  10/28/2019   ID:  Joel Lynch, DOB [birth year], MRN [id number]  PCP:  Martinique, [name], MD  Cardiologist:  Jenean Lindau, MD   Referring MD: Martinique, [name], MD    ASSESSMENT:    1. Left bundle branch block (LBBB)   2. Mixed dyslipidemia    PLAN:    In order of problems listed above:  1. Primary prevention stressed with the patient.  Importance of compliance with diet medication stressed and vocalized understanding.  Patient mentions to me that he is leading a sedentary lifestyle and has gained weight.  He has abdominal obesity and thoracic spine pain.  Importance of regular exercise stressed I told him to walk at least 30 to 45 minutes a day on a regular basis and he promises to do so. 2. Left bundle branch block stable at this time and asymptomatic 3. Mild dyslipidemia: Lipids were reviewed and diet was emphasized.  Lipids under 100. 4. Patient will be seen in follow-up appointment in 6 months or earlier if the patient has any concerns    Medication Adjustments/Labs and Tests Ordered: Current medicines are reviewed at length with the patient today.  Concerns regarding medicines are outlined above.  No orders of the defined types were placed in this encounter.  No orders of the defined types were placed in this encounter.    No chief complaint on file.    History of Present Illness:    Joel Lynch is a 48 y.o. male.  Patient was evaluated by symptoms of orthostatic hypotension.  He has left bundle branch block.  He denies any problems at this time and takes care of activities of daily living.  No chest pain orthopnea or PND.  He leads a sedentary lifestyle.  At the time of my evaluation, the patient is alert awake oriented and in no distress.  Past Medical History:  Diagnosis Date  . Allergy   . Arthritis   . Chicken pox   . Headache(784.0)   . Left bundle branch block     Past Surgical History:  Procedure Laterality Date    . WRIST SURGERY  2003   broken hand/wrist    Current Medications: Current Meds  Medication Sig  . diphenhydrAMINE (BENADRYL) 25 MG tablet Take 25 mg by mouth every 6 (six) hours as needed for allergies.  Marland Kitchen ibuprofen (ADVIL,MOTRIN) 800 MG tablet Take 800 mg by mouth every 8 (eight) hours as needed for headache.  . loratadine (CLARITIN) 10 MG tablet Take 10 mg by mouth daily as needed for allergies.     Allergies:   Patient has no known allergies.   Social History   Socioeconomic History  . Marital status: Married    Spouse name: Not on file  . Number of children: 1  . Years of education: PhD  . Highest education level: Not on file  Occupational History  . Occupation: professor of Doctor, hospital: [employer]  Tobacco Use  . Smoking status: Never Smoker  . Smokeless tobacco: Never Used  Vaping Use  . Vaping Use: Never used  Substance and Sexual Activity  . Alcohol use: Yes    Comment: 3 - 4 times per week   . Drug use: No  . Sexual activity: Not on file  Other Topics Concern  . Not on file  Social History Narrative   Lives with wife and child in a one story home.  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  Education: Phd   Social Determinants of Radio broadcast assistant Strain:   . Difficulty of Paying Living Expenses: Not on file  Food Insecurity:   . Worried About Charity fundraiser in the Last Year: Not on file  . Ran Out of Food in the Last Year: Not on file  Transportation Needs:   . Lack of Transportation (Medical): Not on file  . Lack of Transportation (Non-Medical): Not on file  Physical Activity:   . Days of Exercise per Week: Not on file  . Minutes of Exercise per Session: Not on file  Stress:   . Feeling of Stress : Not on file  Social Connections:   . Frequency of Communication with Friends and Family: Not on file  . Frequency of Social Gatherings with Friends and Family: Not on file  . Attends Religious Services: Not on file  . Active  Member of Clubs or Organizations: Not on file  . Attends Archivist Meetings: Not on file  . Marital Status: Not on file     Family History: The patient's family history includes Arthritis in his father and paternal grandmother; Cancer (age of onset: 27) in his father; Diabetes in his mother; Heart disease in his paternal grandmother; Hyperlipidemia in his mother; Hypertension in his paternal grandmother; Stroke in his maternal grandmother.  ROS:   Please see the history of present illness.    All other systems reviewed and are negative.  EKGs/Labs/Other Studies Reviewed:    The following studies were reviewed today: I discussed my findings with the patient at length.   Recent Labs: 02/09/2019: ALT 24; BUN 14; Creatinine, Ser 0.85; Hemoglobin 16.4; Platelets 205; Potassium 4.4; Sodium 139; TSH 1.560  Recent Lipid Panel    Component Value Date/Time   CHOL 184 02/09/2019 0849   TRIG 85 02/09/2019 0849   HDL 57 02/09/2019 0849   CHOLHDL 3.2 02/09/2019 0849   CHOLHDL 4 05/10/2012 1454   VLDL 17.2 05/10/2012 1454   LDLCALC 111 (H) 02/09/2019 0849    Physical Exam:    VS:  BP 114/70   Pulse 88   Ht 5\' 9"  (1.753 m)   Wt 207 lb 6.4 oz (94.1 kg)   SpO2 96%   BMI 30.63 kg/m     Wt Readings from Last 3 Encounters:  10/28/19 207 lb 6.4 oz (94.1 kg)  01/28/19 202 lb 6.4 oz (91.8 kg)  09/08/17 200 lb 6.4 oz (90.9 kg)     GEN: Patient is in no acute distress HEENT: Normal NECK: No JVD; No carotid bruits LYMPHATICS: No lymphadenopathy CARDIAC: Hear sounds regular, 2/6 systolic murmur at the apex. RESPIRATORY:  Clear to auscultation without rales, wheezing or rhonchi  ABDOMEN: Soft, non-tender, non-distended MUSCULOSKELETAL:  No edema; No deformity  SKIN: Warm and dry NEUROLOGIC:  Alert and oriented x 3 PSYCHIATRIC:  Normal affect   Signed, Jenean Lindau, MD  10/28/2019 2:30 PM    [place]

## 2020-04-26 DIAGNOSIS — M1990 Unspecified osteoarthritis, unspecified site: Secondary | ICD-10-CM | POA: Insufficient documentation

## 2020-04-26 DIAGNOSIS — B019 Varicella without complication: Secondary | ICD-10-CM | POA: Insufficient documentation

## 2020-04-26 DIAGNOSIS — I447 Left bundle-branch block, unspecified: Secondary | ICD-10-CM | POA: Insufficient documentation

## 2020-04-26 DIAGNOSIS — T7840XA Allergy, unspecified, initial encounter: Secondary | ICD-10-CM | POA: Insufficient documentation

## 2020-04-27 ENCOUNTER — Ambulatory Visit: Payer: BC Managed Care – PPO | Admitting: Cardiology

## 2020-06-14 ENCOUNTER — Ambulatory Visit: Payer: Self-pay | Admitting: Cardiology

## 2020-06-27 ENCOUNTER — Other Ambulatory Visit: Payer: Self-pay

## 2020-06-27 ENCOUNTER — Encounter: Payer: Self-pay | Admitting: Cardiology

## 2020-06-27 ENCOUNTER — Ambulatory Visit: Payer: BC Managed Care – PPO | Admitting: Cardiology

## 2020-06-27 VITALS — BP 114/76 | HR 96 | Ht 69.0 in | Wt 213.2 lb

## 2020-06-27 DIAGNOSIS — R0600 Dyspnea, unspecified: Secondary | ICD-10-CM

## 2020-06-27 DIAGNOSIS — R0609 Other forms of dyspnea: Secondary | ICD-10-CM

## 2020-06-27 DIAGNOSIS — R0789 Other chest pain: Secondary | ICD-10-CM

## 2020-06-27 DIAGNOSIS — I447 Left bundle-branch block, unspecified: Secondary | ICD-10-CM | POA: Diagnosis not present

## 2020-06-27 DIAGNOSIS — Q219 Congenital malformation of cardiac septum, unspecified: Secondary | ICD-10-CM

## 2020-06-27 DIAGNOSIS — R931 Abnormal findings on diagnostic imaging of heart and coronary circulation: Secondary | ICD-10-CM

## 2020-06-27 HISTORY — DX: Other forms of dyspnea: R06.09

## 2020-06-27 HISTORY — DX: Dyspnea, unspecified: R06.00

## 2020-06-27 HISTORY — DX: Other chest pain: R07.89

## 2020-06-27 MED ORDER — METOPROLOL TARTRATE 100 MG PO TABS: 100.0000 mg | ORAL_TABLET | Freq: Once | ORAL | 0 refills | Status: DC

## 2020-06-27 NOTE — Progress Notes (Signed)
Cardiology Office Note:    Date:  06/27/2020   ID:  Joel Lynch, DOB 04/19/1970, MRN [id number]  PCP:  Martinique, [name], MD  Cardiologist:  Jenean Lindau, MD   Referring MD: Martinique, [name], MD    ASSESSMENT:    1. Left bundle branch block (LBBB)   2. DOE (dyspnea on exertion)   3. Chest tightness    PLAN:    In order of problems listed above:  1. Dyspnea on exertion: Angina pectoris: Patient symptoms are concerning and this is affecting his quality of life.  In view of this multiple choices of evaluations were given to the patient  including invasive and noninvasive options and patient opted for CT coronary angiography with FFR.  I think this will help [place] understand coronary anatomy.  He also has left bundle branch block. 2. Mixed dyslipidemia: Diet was emphasized.  I discussed this with him at extensive length and he vocalized understanding. 3. Abdominal obesity: Risks explained and he promises to do better with diet and eventually with exercise once his testing is done. 4. Patient will be seen in follow-up appointment in 6 months or earlier if the patient has any concerns    Medication Adjustments/Labs and Tests Ordered: Current medicines are reviewed at length with the patient today.  Concerns regarding medicines are outlined above.  No orders of the defined types were placed in this encounter.  No orders of the defined types were placed in this encounter.    No chief complaint on file.    History of Present Illness:    Joel Lynch is a 49 y.o. male.  Patient has past medical history of mild dyslipidemia and left bundle branch block.  He mentions to me that recently over the past several months he is having dyspnea on exertion.  He almost felt that he was passing out when he was trying to climb a couple of flights of stairs.  No orthopnea or PND.  He feels chest tightness at times.  The symptoms are concerning to him and he is now leading a sedentary lifestyle  and has gained weight.  At the time of my evaluation, the patient is alert awake oriented and in no distress.  Patient requests evaluation for this.  Past Medical History:  Diagnosis Date  . Allergy   . ARTHRALGIA 03/07/2010   Qualifier: Diagnosis of  By: Elizebeth Koller MD, Mora Appl   . Arthritis   . Chicken pox   . Headache(784.0)   . Left bundle branch block   . Left bundle branch block (LBBB) 12/29/2016  . Mixed dyslipidemia 10/28/2019  . Non-traumatic mid back pain 04/24/2016  . Syncope 11/28/2016    Past Surgical History:  Procedure Laterality Date  . WRIST SURGERY  2003   broken hand/wrist    Current Medications: Current Meds  Medication Sig  . diphenhydrAMINE (BENADRYL) 25 MG tablet Take 25 mg by mouth every 6 (six) hours as needed for allergies.  Marland Kitchen ibuprofen (ADVIL,MOTRIN) 800 MG tablet Take 800 mg by mouth every 8 (eight) hours as needed for headache.  . loratadine (CLARITIN) 10 MG tablet Take 10 mg by mouth daily as needed for allergies.     Allergies:   Patient has no known allergies.   Social History   Socioeconomic History  . Marital status: Married    Spouse name: Not on file  . Number of children: 1  . Years of education: PhD  . [facility] [employer] [facility] education level: Not on file  Occupational History  . 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 Smoking status: Never Smoker  . Smokeless tobacco: Never Used  Vaping Use  . Vaping Use: Never used  Substance and Sexual Activity  . Alcohol use: Yes    Comment: 3 - 4 times per week   . Drug use: No  . Sexual activity: Not on file  Other Topics Concern  . Not on file  Social History Narrative   Lives with wife and child in a one story home.  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  Education: Phd   Social Determinants of Radio broadcast assistant Strain: Not on file  Food Insecurity: Not on file  Transportation Needs: Not on file  Physical Activity: Not on file  Stress: Not on file   Social Connections: Not on file     Family History: The patient's family history includes Arthritis in his father and paternal grandmother; Cancer (age of onset: 65) in his father; Diabetes in his mother; Heart disease in his paternal grandmother; Hyperlipidemia in his mother; Hypertension in his paternal grandmother; Stroke in his maternal grandmother.  ROS:   Please see the history of present illness.    All other systems reviewed and are negative.  EKGs/Labs/Other Studies Reviewed:    The following studies were reviewed today: EKG was sinus rhythm and left bundle branch block and nonspecific ST changes   Recent Labs: No results found for requested labs within last 8760 hours.  Recent Lipid Panel    Component Value Date/Time   CHOL 184 02/09/2019 0849   TRIG 85 02/09/2019 0849   HDL 57 02/09/2019 0849   CHOLHDL 3.2 02/09/2019 0849   CHOLHDL 4 05/10/2012 1454   VLDL 17.2 05/10/2012 1454   LDLCALC 111 (H) 02/09/2019 0849    Physical Exam:    VS:  BP 114/76   Pulse 96   Ht 5\' 9"  (1.753 m)   Wt 213 lb 3.2 oz (96.7 kg)   SpO2 96%   BMI 31.48 kg/m     Wt Readings from Last 3 Encounters:  06/27/20 213 lb 3.2 oz (96.7 kg)  10/28/19 207 lb 6.4 oz (94.1 kg)  01/28/19 202 lb 6.4 oz (91.8 kg)     GEN: Patient is in no acute distress HEENT: Normal NECK: No JVD; No carotid bruits LYMPHATICS: No lymphadenopathy CARDIAC: Hear sounds regular, 2/6 systolic murmur at the apex. RESPIRATORY:  Clear to auscultation without rales, wheezing or rhonchi  ABDOMEN: Soft, non-tender, non-distended MUSCULOSKELETAL:  No edema; No deformity  SKIN: Warm and dry NEUROLOGIC:  Alert and oriented x 3 PSYCHIATRIC:  Normal affect   Signed, Jenean Lindau, MD  06/27/2020 2:38 PM    [place]

## 2020-06-27 NOTE — Patient Instructions (Addendum)
Medication Instructions:  No medication changes. *If you need a refill on your cardiac medications before your next appointment, please call your pharmacy*   Lab Work: None ordered If you have labs (blood work) drawn today and your tests are completely normal, you will receive your results only by: Marland Kitchen MyChart Message (if you have MyChart) OR . A paper copy in the mail If you have any lab test that is abnormal or we need to change your treatment, we will call you to review the results.   Testing/Procedures: Your cardiac CT will be scheduled at:   [facility] [place], [place] [address] [phone]   If scheduled at [facility], please arrive at the [facility] main entrance of [facility] 30 minutes prior to test start time. Proceed to the [facility] Radiology Department (first floor) to check-in and test prep.  Please follow these instructions carefully (unless otherwise directed):  Hold all erectile dysfunction medications at least 3 days (72 hrs) prior to test.  On the Night Before the Test: . Be sure to Drink plenty of water. . Do not consume any caffeinated/decaffeinated beverages or chocolate 12 hours prior to your test. . Do not take any antihistamines 12 hours prior to your test.   On the Day of the Test: . Drink plenty of water. Do not drink any water within one hour of the test. . Do not eat any food 4 hours prior to the test. . You may take your regular medications prior to the test.  . Take metoprolol (Lopressor) two hours prior to test.     After the Test: . Drink plenty of water. . After receiving IV contrast, you may experience a mild flushed feeling. This is normal. . On occasion, you may experience a mild rash up to 24 hours after the test. This is not dangerous. If this occurs, you can take Benadryl 25 mg and increase your fluid intake. . If you experience trouble breathing, this can be serious. If it is  severe call 911 IMMEDIATELY. If it is mild, please call our office. . If you take any of these medications: Glipizide/Metformin, Avandament, Glucavance, please do not take 48 hours after completing test unless otherwise instructed.   Once we have confirmed authorization from your insurance company, we will call you to set up a date and time for your test. Based on how quickly your insurance processes prior authorizations requests, please allow up to 4 weeks to be contacted for scheduling your Cardiac CT appointment. Be advised that routine Cardiac CT appointments could be scheduled as many as 8 weeks after your provider has ordered it.  For non-scheduling related questions, please contact the cardiac imaging nurse navigator should you have any questions/concerns: Marchia Bond, Cardiac Imaging Nurse Navigator Burley Saver, Interim Cardiac Imaging Nurse [facility] Direct Office Dial: [phone]   For scheduling needs, including cancellations and rescheduling, please call Vivien Rota at [phone].     Follow-Up: At [facility], you and your health needs are our priority.  As part of our continuing mission to provide you with exceptional heart care, we have created designated Provider Care Teams.  These Care Teams include your primary Cardiologist (physician) and Advanced Practice Providers (APPs -  Physician Assistants and Nurse Practitioners) who all work together to provide you with the care you need, when you need it.  We recommend signing up for the patient portal called "MyChart".  Sign up  information is provided on this After Visit Summary.  MyChart is used to connect with patients for Virtual Visits (Telemedicine).  Patients are able to view lab/test results, encounter notes, upcoming appointments, etc.  Non-urgent messages can be sent to your provider as well.   To learn more about what you can do with MyChart, go to [email or web address].    Your next  appointment:   6 month(s)  The format for your next appointment:   In Person  Provider:   Jyl Heinz, MD   Other Instructions Cardiac CT Angiogram A cardiac CT angiogram is a procedure to look at the heart and the area around the heart. It may be done to help find the cause of chest pains or other symptoms of heart disease. During this procedure, a substance called contrast dye is injected into the blood vessels in the area to be checked. A large X-ray machine, called a CT scanner, then takes detailed pictures of the heart and the surrounding area. The procedure is also sometimes called a coronary CT angiogram, coronary artery scanning, or CTA. A cardiac CT angiogram allows the health care provider to see how well blood is flowing to and from the heart. The health care provider will be able to see if there are any problems, such as:  Blockage or narrowing of the coronary arteries in the heart.  Fluid around the heart.  Signs of weakness or disease in the muscles, valves, and tissues of the heart. Tell a health care provider about:  Any allergies you have. This is especially important if you have had a previous allergic reaction to contrast dye.  All medicines you are taking, including vitamins, herbs, eye drops, creams, and over-the-counter medicines.  Any blood disorders you have.  Any surgeries you have had.  Any medical conditions you have.  Whether you are pregnant or may be pregnant.  Any anxiety disorders, chronic pain, or other conditions you have that may increase your stress or prevent you from lying still. What are the risks? Generally, this is a safe procedure. However, problems may occur, including: 1. Bleeding. 2. Infection. 3. Allergic reactions to medicines or dyes. 4. Damage to other structures or organs. 5. Kidney damage from the contrast dye that is used. 6. Increased risk of cancer from radiation exposure. This risk is low. Talk with your health care  provider about: ? The risks and benefits of testing. ? How you can receive the lowest dose of radiation. What happens before the procedure? 1. Wear comfortable clothing and remove any jewelry, glasses, dentures, and hearing aids. 2. Follow instructions from your health care provider about eating and drinking. This may include: ? For 12 hours before the procedure -- avoid caffeine. This includes tea, coffee, soda, energy drinks, and diet pills. Drink plenty of water or other fluids that do not have caffeine in them. Being well hydrated can prevent complications. ? For 4-6 hours before the procedure -- stop eating and drinking. The contrast dye can cause nausea, but this is less likely if your stomach is empty. 3. Ask your health care provider about changing or stopping your regular medicines. This is especially important if you are taking diabetes medicines, blood thinners, or medicines to treat problems with erections (erectile dysfunction). What happens during the procedure?  1. Hair on your chest may need to be removed so that small sticky patches called electrodes can be placed on your chest. These will transmit information that helps to monitor your heart during the procedure.  2. An IV will be inserted into one of your veins. 3. You might be given a medicine to control your heart rate during the procedure. This will help to ensure that good images are obtained. 4. You will be asked to lie on an exam table. This table will slide in and out of the CT machine during the procedure. 5. Contrast dye will be injected into the IV. You might feel warm, or you may get a metallic taste in your mouth. 6. You will be given a medicine called nitroglycerin. This will relax or dilate the arteries in your heart. 7. The table that you are lying on will move into the CT machine tunnel for the scan. 8. The person running the machine will give you instructions while the scans are being done. You may be asked  to: ? Keep your arms above your head. ? Hold your breath. ? Stay very still, even if the table is moving. 9. When the scanning is complete, you will be moved out of the machine. 10. The IV will be removed. The procedure may vary among health care providers and hospitals. What can I expect after the procedure? After your procedure, it is common to have:  A metallic taste in your mouth from the contrast dye.  A feeling of warmth.  A headache from the nitroglycerin. Follow these instructions at home:  Take over-the-counter and prescription medicines only as told by your health care provider.  If you are told, drink enough fluid to keep your urine pale yellow. This will help to flush the contrast dye out of your body.  Most people can return to their normal activities right after the procedure. Ask your health care provider what activities are safe for you.  It is up to you to get the results of your procedure. Ask your health care provider, or the department that is doing the procedure, when your results will be ready.  Keep all follow-up visits as told by your health care provider. This is important. Contact a health care provider if: 1. You have any symptoms of allergy to the contrast dye. These include: ? Shortness of breath. ? Rash or hives. ? A racing heartbeat. Summary  A cardiac CT angiogram is a procedure to look at the heart and the area around the heart. It may be done to help find the cause of chest pains or other symptoms of heart disease.  During this procedure, a large X-ray machine, called a CT scanner, takes detailed pictures of the heart and the surrounding area after a contrast dye has been injected into blood vessels in the area.  Ask your health care provider about changing or stopping your regular medicines before the procedure. This is especially important if you are taking diabetes medicines, blood thinners, or medicines to treat erectile dysfunction.  If  you are told, drink enough fluid to keep your urine pale yellow. This will help to flush the contrast dye out of your body. This information is not intended to replace advice given to you by your health care provider. Make sure you discuss any questions you have with your health care provider. Document Revised: 09/29/2018 Document Reviewed: 09/29/2018 Elsevier Patient Education  [place].

## 2020-08-08 ENCOUNTER — Telehealth: Payer: Self-pay | Admitting: Cardiology

## 2020-08-08 DIAGNOSIS — R0789 Other chest pain: Secondary | ICD-10-CM

## 2020-08-08 DIAGNOSIS — R0609 Other forms of dyspnea: Secondary | ICD-10-CM

## 2020-08-08 MED ORDER — METOPROLOL TARTRATE 100 MG PO TABS: 100.0000 mg | ORAL_TABLET | Freq: Once | ORAL | 0 refills | Status: DC

## 2020-08-08 NOTE — Telephone Encounter (Signed)
*  STAT* If patient is at the pharmacy, call can be transferred to refill team.   1. Which medications need to be refilled? (please list name of each medication and dose if known)  metoprolol tartrate (LOPRESSOR) 100 MG tablet  2. Which pharmacy/location (including street and city if local pharmacy) is medication to be sent to? [id number] - [place], [place] - [address]  3. Do they need a 30 day or 90 day supply? [place]

## 2020-08-08 NOTE — Telephone Encounter (Signed)
Called and spoke to patient. He repost he needs the one time dose of metoprolol tartrate 100 mg sent back in for his ct. He never picked up the original rx and it is now expired. Sent this in for him.

## 2020-08-13 ENCOUNTER — Telehealth (HOSPITAL_COMMUNITY): Payer: Self-pay | Admitting: *Deleted

## 2020-08-13 NOTE — Telephone Encounter (Signed)
Reaching out to patient to offer assistance regarding upcoming cardiac imaging study; pt verbalizes understanding of appt date/time, parking situation and where to check in, pre-test NPO status and medications ordered, and verified current allergies; name and call back number provided for further questions should they arise  Laquenta Whitsell RN Navigator Cardiac Imaging [facility] [phone] office [phone] cell  

## 2020-08-15 ENCOUNTER — Ambulatory Visit (HOSPITAL_COMMUNITY)
Admission: RE | Admit: 2020-08-15 | Discharge: 2020-08-15 | Disposition: A | Discharge status: Home / Self Care | Payer: BC Managed Care – PPO | Source: Ambulatory Visit | Attending: Cardiology | Admitting: Cardiology

## 2020-08-15 ENCOUNTER — Other Ambulatory Visit: Payer: Self-pay

## 2020-08-15 DIAGNOSIS — R0789 Other chest pain: Secondary | ICD-10-CM

## 2020-08-15 DIAGNOSIS — R0600 Dyspnea, unspecified: Secondary | ICD-10-CM | POA: Diagnosis not present

## 2020-08-15 DIAGNOSIS — R0609 Other forms of dyspnea: Secondary | ICD-10-CM

## 2020-08-15 MED ORDER — IOHEXOL 350 MG/ML SOLN
95.0000 mL | Freq: Once | INTRAVENOUS | Status: AC | PRN
  Administered 2020-08-15: 95 mL via INTRAVENOUS

## 2020-08-15 MED ORDER — NITROGLYCERIN 0.4 MG SL SUBL
SUBLINGUAL_TABLET | SUBLINGUAL | Status: AC
  Filled 2020-08-15: qty 1

## 2020-08-15 MED ORDER — NITROGLYCERIN 0.4 MG SL SUBL: 0.8000 mg | SUBLINGUAL_TABLET | Freq: Once | SUBLINGUAL | Status: DC

## 2020-08-15 MED ORDER — NITROGLYCERIN 0.4 MG SL SUBL
0.4000 mg | SUBLINGUAL_TABLET | Freq: Once | SUBLINGUAL | Status: AC
  Administered 2020-08-15: 0.4 mg via SUBLINGUAL

## 2020-08-15 NOTE — Addendum Note (Signed)
Addended by: Truddie Hidden on: 08/15/2020 02:03 PM   Modules accepted: Orders

## 2020-09-01 LAB — HEPATIC FUNCTION PANEL
ALT: 21 IU/L (ref 0–44)
AST: 20 IU/L (ref 0–40)
Albumin: 4.5 g/dL (ref 4.0–5.0)
Alkaline Phosphatase: 63 IU/L (ref 44–121)
Bilirubin Total: 0.7 mg/dL (ref 0.0–1.2)
Bilirubin, Direct: 0.18 mg/dL (ref 0.00–0.40)
Total Protein: 6.9 g/dL (ref 6.0–8.5)

## 2020-09-01 LAB — LIPID PANEL
Chol/HDL Ratio: 3.8 ratio (ref 0.0–5.0)
Cholesterol, Total: 172 mg/dL (ref 100–199)
HDL: 45 mg/dL (ref >39)
LDL Chol Calc (NIH): 109 mg/dL — ABNORMAL HIGH (ref 0–99)
Triglycerides: 95 mg/dL (ref 0–149)
VLDL Cholesterol Cal: 18 mg/dL (ref 5–40)

## 2020-09-03 ENCOUNTER — Other Ambulatory Visit: Payer: Self-pay

## 2020-09-03 ENCOUNTER — Ambulatory Visit (INDEPENDENT_AMBULATORY_CARE_PROVIDER_SITE_OTHER): Payer: BC Managed Care – PPO

## 2020-09-03 DIAGNOSIS — Q219 Congenital malformation of cardiac septum, unspecified: Secondary | ICD-10-CM | POA: Diagnosis not present

## 2020-09-03 LAB — ECHOCARDIOGRAM COMPLETE
Area-P 1/2: 3.39 cm2
Calc EF: 51.3 %
S' Lateral: 3 cm
Single Plane A2C EF: 51.6 %
Single Plane A4C EF: 50 %

## 2020-09-03 NOTE — Progress Notes (Signed)
Complete echocardiogram performed.  Jimmy Curlee Bogan RDCS, RVT  

## 2020-09-04 ENCOUNTER — Telehealth: Payer: Self-pay | Admitting: Cardiology

## 2020-09-04 DIAGNOSIS — R519 Headache, unspecified: Secondary | ICD-10-CM | POA: Insufficient documentation

## 2020-09-04 NOTE — Telephone Encounter (Signed)
Pt is requesting an earlier appointment to discuss recent test results. Appointment made as requested.

## 2020-09-04 NOTE — Telephone Encounter (Signed)
° °  Pt is returning call to get echo result °

## 2020-09-05 ENCOUNTER — Ambulatory Visit (INDEPENDENT_AMBULATORY_CARE_PROVIDER_SITE_OTHER): Payer: BC Managed Care – PPO

## 2020-09-05 ENCOUNTER — Other Ambulatory Visit: Payer: Self-pay

## 2020-09-05 ENCOUNTER — Ambulatory Visit: Payer: BC Managed Care – PPO | Admitting: Cardiology

## 2020-09-05 ENCOUNTER — Encounter: Payer: Self-pay | Admitting: Cardiology

## 2020-09-05 VITALS — BP 104/70 | HR 78 | Ht 69.0 in | Wt 204.2 lb

## 2020-09-05 DIAGNOSIS — I447 Left bundle-branch block, unspecified: Secondary | ICD-10-CM | POA: Diagnosis not present

## 2020-09-05 DIAGNOSIS — R55 Syncope and collapse: Secondary | ICD-10-CM

## 2020-09-05 DIAGNOSIS — I472 Ventricular tachycardia: Secondary | ICD-10-CM

## 2020-09-05 DIAGNOSIS — I4729 Other ventricular tachycardia: Secondary | ICD-10-CM | POA: Insufficient documentation

## 2020-09-05 DIAGNOSIS — I2510 Atherosclerotic heart disease of native coronary artery without angina pectoris: Secondary | ICD-10-CM

## 2020-09-05 DIAGNOSIS — E782 Mixed hyperlipidemia: Secondary | ICD-10-CM | POA: Diagnosis not present

## 2020-09-05 HISTORY — DX: Ventricular tachycardia: I47.2

## 2020-09-05 HISTORY — DX: Other ventricular tachycardia: I47.29

## 2020-09-05 HISTORY — DX: Atherosclerotic heart disease of native coronary artery without angina pectoris: I25.10

## 2020-09-05 MED ORDER — ROSUVASTATIN CALCIUM 10 MG PO TABS: 10.0000 mg | ORAL_TABLET | Freq: Every day | ORAL | 3 refills | Status: DC

## 2020-09-05 MED ORDER — ASPIRIN EC 81 MG PO TBEC: 81.0000 mg | DELAYED_RELEASE_TABLET | Freq: Every day | ORAL | 3 refills | Status: DC

## 2020-09-05 NOTE — Progress Notes (Signed)
Cardiology Office Note:    Date:  09/05/2020   ID:  Joel Lynch, DOB [birth year], MRN [id number]  PCP:  Martinique, [name], MD  Cardiologist:  Jenean Lindau, MD   Referring MD: Martinique, [name], MD    ASSESSMENT:    No diagnosis found. PLAN:    In order of problems listed above:  Coronary artery disease: See report of coronary angiography mentioned below.  Secondary prevention stressed with the patient.  Importance of compliance with diet medication stressed and vocalized understanding.  He was advised to exercise at least half an hour a day 5 days a week and he promises to do so. Mixed dyslipidemia: Diet was emphasized.  I advised him to take rosuvastatin 10 mg daily and be back in 6 weeks for liver lipid check.  Diet and exercise stressed.  Weight reduction stressed Obesity: Abdominal obesity: I discussed diet and exercise and weight reduction and he promises to do better. He was also advised to take a coated baby aspirin on a daily basis. History of 4 beat nonsustained ventricular tachycardia.  He gives history of some dizziness like symptoms.  No palpitations or syncopal spells.  He has history of left bundle branch block.  We will do a 1 month monitoring.  There was some issue about septal thickening on CT scan but nothing remarkable significant was found on echocardiogram which I reviewed again today.  I discussed findings with him at length Patient will be seen in follow-up appointment in 6 months or earlier if the patient has any concerns    Medication Adjustments/Labs and Tests Ordered: Current medicines are reviewed at length with the patient today.  Concerns regarding medicines are outlined above.  No orders of the defined types were placed in this encounter.  No orders of the defined types were placed in this encounter.    No chief complaint on file.    History of Present Illness:    Joel Lynch is a 50 y.o. male.  Patient has history of mixed dyslipidemia.   He was evaluated with CT coronary angiography and this revealed nonobstructive disease in the LAD.  Details are mentioned below.  Subsequently patient has done fine.  No chest pain orthopnea or PND.  He mentions to me that when he tries to exercise or when he is out there in hot weather he feels a little lightheaded.  No palpitations or any such symptoms.  At the time of my evaluation, the patient is alert awake oriented and in no distress.  Past Medical History:  Diagnosis Date   Allergy    ARTHRALGIA 03/07/2010   Qualifier: Diagnosis of  By: Elizebeth Koller MD, Mora Appl    Arthritis    Chest tightness 06/27/2020   Chicken pox    DOE (dyspnea on exertion) 06/27/2020   Headache    Left bundle branch block    Left bundle branch block (LBBB) 12/29/2016   Mixed dyslipidemia 10/28/2019   Non-traumatic mid back pain 04/24/2016   Syncope 11/28/2016    Past Surgical History:  Procedure Laterality Date   WRIST SURGERY  2003   broken hand/wrist    Current Medications: Current Meds  Medication Sig   diphenhydrAMINE (BENADRYL) 25 MG tablet Take 25 mg by mouth every 6 (six) hours as needed for allergies.   ibuprofen (ADVIL,MOTRIN) 800 MG tablet Take 800 mg by mouth every 8 (eight) hours as needed for headache.   loratadine (CLARITIN) 10 MG tablet Take 10 mg by mouth daily  as needed for allergies.     Allergies:   Patient has no known allergies.   Social History   Socioeconomic History   Marital status: Married    Spouse name: Not on file   Number of children: 1   Years of education: PhD   Highest education level: Not on file  Occupational History   Occupation: professor of Doctor, hospital: [employer]  Tobacco Use   Smoking status: Never   Smokeless tobacco: Never  Vaping Use   Vaping Use: Never used  Substance and Sexual Activity   Alcohol use: Yes    Comment: 3 - 4 times per week    Drug use: No   Sexual activity: Not on file  Other Topics Concern   Not on file  Social  History Narrative   Lives with wife and child in a one story home.  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  Education: Phd   Social Determinants of Radio broadcast assistant Strain: Not on file  Food Insecurity: Not on file  Transportation Needs: Not on file  Physical Activity: Not on file  Stress: Not on file  Social Connections: Not on file     Family History: The patient's family history includes Arthritis in his father and paternal grandmother; Cancer (age of onset: 3) in his father; Diabetes in his mother; Heart disease in his paternal grandmother; Hyperlipidemia in his mother; Hypertension in his paternal grandmother; Stroke in his maternal grandmother.  ROS:   Please see the history of present illness.    All other systems reviewed and are negative.  EKGs/Labs/Other Studies Reviewed:    The following studies were reviewed today: IMPRESSION: 1. Coronary calcium score of 17. This was 78th percentile for age, sex, and race matched control.   2. Normal coronary origin with left dominance.   3. There is notable septal hypertrophy: 16 mm consider correlation with echocardiogram if clinically indicated.   4. CAD-RADS 2. Mild non-obstructive CAD (25-49%). Consider non-atherosclerotic causes of chest pain. Consider preventive therapy and risk factor modification.   Recent Labs: 08/31/2020: ALT 21  Recent Lipid Panel    Component Value Date/Time   CHOL 172 08/31/2020 0806   TRIG 95 08/31/2020 0806   HDL 45 08/31/2020 0806   CHOLHDL 3.8 08/31/2020 0806   CHOLHDL 4 05/10/2012 1454   VLDL 17.2 05/10/2012 1454   LDLCALC 109 (H) 08/31/2020 0806    Physical Exam:    VS:  BP 104/70   Pulse 78   Ht 5\' 9"  (1.753 m)   Wt 204 lb 3.2 oz (92.6 kg)   SpO2 95%   BMI 30.16 kg/m     Wt Readings from Last 3 Encounters:  09/05/20 204 lb 3.2 oz (92.6 kg)  06/27/20 213 lb 3.2 oz (96.7 kg)  10/28/19 207 lb 6.4 oz (94.1 kg)     GEN: Patient is in no acute distress HEENT:  Normal NECK: No JVD; No carotid bruits LYMPHATICS: No lymphadenopathy CARDIAC: Hear sounds regular, 2/6 systolic murmur at the apex. RESPIRATORY:  Clear to auscultation without rales, wheezing or rhonchi  ABDOMEN: Soft, non-tender, non-distended MUSCULOSKELETAL:  No edema; No deformity  SKIN: Warm and dry NEUROLOGIC:  Alert and oriented x 3 PSYCHIATRIC:  Normal affect   Signed, Jenean Lindau, MD  09/05/2020 9:12 AM    [facility]

## 2020-09-05 NOTE — Patient Instructions (Signed)
Medication Instructions:  Your physician has recommended you make the following change in your medication:   Start Crestor 10 mg daily. Start 81 mg coated aspirin daily.  *If you need a refill on your cardiac medications before your next appointment, please call your pharmacy*   Lab Work: Your physician recommends that you return for lab work in: 6 weeks (10/17/20). You need to have labs done when you are fasting.  You can come Monday through Friday 8:30 am to 12:00 pm and 1:15 to 4:30. You do not need to make an appointment as the order has already been placed. The labs you are going to have done are BMET, magnesium, LFT and Lipids.  If you have labs (blood work) drawn today and your tests are completely normal, you will receive your results only by: [place] (if you have MyChart) OR A paper copy in the mail If you have any lab test that is abnormal or we need to change your treatment, we will call you to review the results.   Testing/Procedures: Your physician has recommended that you wear an event monitor. Event monitors are medical devices that record the heart's electrical activity. Doctors most often [place] these monitors to diagnose arrhythmias. Arrhythmias are problems with the speed or rhythm of the heartbeat. The monitor is a small, portable device. You can wear one while you do your normal daily activities. This is usually used to diagnose what is causing palpitations/syncope (passing out).    Follow-Up: At [facility], you and your health needs are our priority.  As part of our continuing mission to provide you with exceptional heart care, we have created designated Provider Care Teams.  These Care Teams include your primary Cardiologist (physician) and Advanced Practice Providers (APPs -  Physician Assistants and Nurse Practitioners) who all work together to provide you with the care you need, when you need it.  We recommend signing up for the patient portal called  "MyChart".  Sign up information is provided on this After Visit Summary.  MyChart is used to connect with patients for Virtual Visits (Telemedicine).  Patients are able to view lab/test results, encounter notes, upcoming appointments, etc.  Non-urgent messages can be sent to your provider as well.   To learn more about what you can do with MyChart, go to [email or web address].    Your next appointment:   6 month(s)  The format for your next appointment:   In Person  Provider:   Jyl Heinz, MD   Other Instructions NA

## 2020-09-10 ENCOUNTER — Telehealth: Payer: Self-pay

## 2020-09-10 DIAGNOSIS — I4729 Other ventricular tachycardia: Secondary | ICD-10-CM

## 2020-09-10 DIAGNOSIS — I472 Ventricular tachycardia: Secondary | ICD-10-CM

## 2020-09-10 MED ORDER — METOPROLOL SUCCINATE ER 25 MG PO TB24: 25.0000 mg | ORAL_TABLET | Freq: Every day | ORAL | 3 refills | Status: DC

## 2020-09-10 NOTE — Telephone Encounter (Signed)
No VM set up.

## 2020-09-11 NOTE — Telephone Encounter (Signed)
Pt has been advised of recommendations. Pt is concerned with taking metoprolol with his BP being low advised that this is a low dose and he could take 12.5 mg in the morning and 12.5 mg in the evening. Pt would like to know what caused the nonsustained v-tach.

## 2020-09-11 NOTE — Telephone Encounter (Signed)
Home phone dc'd and no VM on message phone.

## 2020-09-17 ENCOUNTER — Inpatient Hospital Stay (HOSPITAL_COMMUNITY)
Admission: EM | Admit: 2020-09-17 | Discharge: 2020-09-19 | DRG: 244 | Disposition: A | Discharge status: Home / Self Care | Payer: BC Managed Care – PPO | Attending: Cardiology | Admitting: Cardiology

## 2020-09-17 ENCOUNTER — Encounter (HOSPITAL_COMMUNITY): Payer: Self-pay

## 2020-09-17 ENCOUNTER — Other Ambulatory Visit: Payer: Self-pay

## 2020-09-17 ENCOUNTER — Emergency Department (HOSPITAL_COMMUNITY): Payer: BC Managed Care – PPO

## 2020-09-17 ENCOUNTER — Telehealth: Payer: Self-pay | Admitting: *Deleted

## 2020-09-17 DIAGNOSIS — Z7982 Long term (current) use of aspirin: Secondary | ICD-10-CM

## 2020-09-17 DIAGNOSIS — I471 Supraventricular tachycardia: Secondary | ICD-10-CM | POA: Diagnosis present

## 2020-09-17 DIAGNOSIS — I442 Atrioventricular block, complete: Principal | ICD-10-CM

## 2020-09-17 DIAGNOSIS — I2510 Atherosclerotic heart disease of native coronary artery without angina pectoris: Secondary | ICD-10-CM | POA: Diagnosis not present

## 2020-09-17 DIAGNOSIS — I517 Cardiomegaly: Secondary | ICD-10-CM

## 2020-09-17 DIAGNOSIS — R55 Syncope and collapse: Secondary | ICD-10-CM

## 2020-09-17 DIAGNOSIS — Z79899 Other long term (current) drug therapy: Secondary | ICD-10-CM

## 2020-09-17 DIAGNOSIS — Z20822 Contact with and (suspected) exposure to covid-19: Secondary | ICD-10-CM | POA: Diagnosis present

## 2020-09-17 DIAGNOSIS — Z807 Family history of other malignant neoplasms of lymphoid, hematopoietic and related tissues: Secondary | ICD-10-CM

## 2020-09-17 DIAGNOSIS — Z959 Presence of cardiac and vascular implant and graft, unspecified: Secondary | ICD-10-CM

## 2020-09-17 DIAGNOSIS — Z8249 Family history of ischemic heart disease and other diseases of the circulatory system: Secondary | ICD-10-CM

## 2020-09-17 DIAGNOSIS — E782 Mixed hyperlipidemia: Secondary | ICD-10-CM | POA: Diagnosis present

## 2020-09-17 DIAGNOSIS — Z823 Family history of stroke: Secondary | ICD-10-CM

## 2020-09-17 HISTORY — DX: Syncope and collapse: R55

## 2020-09-17 LAB — TROPONIN I (HIGH SENSITIVITY)
Troponin I (High Sensitivity): 3 ng/L (ref <18)
Troponin I (High Sensitivity): 2 ng/L (ref <18)

## 2020-09-17 LAB — COMPREHENSIVE METABOLIC PANEL
ALT: 31 U/L (ref 0–44)
AST: 28 U/L (ref 15–41)
Albumin: 4.1 g/dL (ref 3.5–5.0)
Alkaline Phosphatase: 49 U/L (ref 38–126)
Anion gap: 7 (ref 5–15)
BUN: 9 mg/dL (ref 6–20)
CO2: 26 mmol/L (ref 22–32)
Calcium: 9.4 mg/dL (ref 8.9–10.3)
Chloride: 105 mmol/L (ref 98–111)
Creatinine, Ser: 1.02 mg/dL (ref 0.61–1.24)
GFR, Estimated: >60 mL/min (ref >60)
Glucose, Bld: 81 mg/dL (ref 70–99)
Potassium: 4 mmol/L (ref 3.5–5.1)
Sodium: 138 mmol/L (ref 135–145)
Total Bilirubin: 1.1 mg/dL (ref 0.3–1.2)
Total Protein: 7.1 g/dL (ref 6.5–8.1)

## 2020-09-17 LAB — BRAIN NATRIURETIC PEPTIDE: B Natriuretic Peptide: 37.1 pg/mL (ref 0.0–100.0)

## 2020-09-17 LAB — HIV ANTIBODY (ROUTINE TESTING W REFLEX): HIV Screen 4th Generation wRfx: NONREACTIVE

## 2020-09-17 LAB — MAGNESIUM: Magnesium: 2.2 mg/dL (ref 1.7–2.4)

## 2020-09-17 MED ORDER — ASPIRIN EC 81 MG PO TBEC
81.0000 mg | DELAYED_RELEASE_TABLET | Freq: Every day | ORAL | Status: DC
  Administered 2020-09-18: 81 mg via ORAL
  Filled 2020-09-17: qty 1

## 2020-09-17 MED ORDER — ROSUVASTATIN CALCIUM 5 MG PO TABS
10.0000 mg | ORAL_TABLET | Freq: Every day | ORAL | Status: DC
  Administered 2020-09-18 – 2020-09-19 (×2): 10 mg via ORAL
  Filled 2020-09-17 (×2): qty 2

## 2020-09-17 NOTE — ED Notes (Signed)
MRI notified this RN that MRI will be done tomorrow as they don't have a cardiac tech to perform the test.

## 2020-09-17 NOTE — ED Triage Notes (Addendum)
Pt reports syncopal event at approximately 1130 today while walking to truck. Pt reports waking up on the concrete, unknown if he struck his head. Pt is currently wearing a heart monitor for these multiple syncopal episodes with dizziness. Monitoring company called patient at 1400 today and told him to come to the ED for evaluation of non-sustained V-tach. Pt denies CP/SOB. Endorses dizziness when ambulating to triage.

## 2020-09-17 NOTE — Telephone Encounter (Signed)
Spoke with pt per Dr. Harriet Masson to go to the [facility] ED right away due to critical report on your heart monitor from [employer]: report showed tachycardia then 3rd degree heart block, bradycardia and ended with sinus rhythm. Pt was reluctant to go but was urged to go due to possible blockage in heart. Pt stated he would go.

## 2020-09-17 NOTE — ED Notes (Signed)
SR on monitor with minor ST elevation noted. No ectopy noted. Patient has not had a syncopal episode or reported dizziness since arriving in ED treatment area.

## 2020-09-17 NOTE — H&P (Addendum)
Cardiology Admission History and Physical:   Patient ID: Joel Lynch MRN: [id number]; DOB: 18-Nov-1970   Admission date: 09/17/2020  PCP:  Martinique, [name], MD   [facility] Providers Cardiologist:  None        Chief Complaint:  Syncope  Patient Profile:   Joel Lynch is a 50 y.o. male with nonobstructive CAD, hyperlipidemia, LBBB who is being seen 09/17/2020 for the evaluation of syncope.  History of Present Illness:   Joel Lynch is a 50 year old male with a history of nonobstructive CAD, hyperlipidemia, LBBB who presents with syncope.  He follows with Dr. Geraldo Pitter, most recently seen on 09/05/2020, had reported syncopal episode months ago.  Underwent coronary CTA on 08/15/2020, which showed nonobstructive CAD with calcium score 17 (78th percentile), calcified plaque in mid LAD and proximal D1 causing mild (25-39%) stenosis.  Coronary CTA was also notable for septal hypertrophy measuring 16 mm.  Echocardiogram on 09/03/2020 showed normal LV systolic function, mild mitral regurgitation; was read as no LVH, but on review looks like there is moderate asymmetric basal septal hypertrophy.  He denies any known family history of HCM or SCD.  He was also started on a 30-day [employer] monitor.  He was found to have NSVT on the monitor and last week was called by Dr Geraldo Pitter and recommended to start metoprolol 12.5 mg BID.  He has been taking metoprolol, last dose this morning.  He presents following a syncopal episode.  He reports that he left work around noon today and was walking towards his truck when he had sudden onset of extreme dizziness.  He leaned up against his truck and the next thing he remembers he woke up on the ground.  Reports fall was unwitnessed.  His cardiology office was called by [employer] to inform that patient had episode of complete heart block today at 11:57AM, and it was recommended that he go to the ED. He reports that he has had 3 other episodes similar to this since  2019.  Each episode has involved having syncope while walking.  Denies any strenuous activity during syncopal episodes.  He reports that since episode started he has not been exercising.  He denies any chest pain but does report some dyspnea on exertion.  Denies any lower extremity edema or palpitations.  In the ED, initial vital signs notable for BP 129/81, pulse 63, SPO2 100% on room air.  EKG shows normal sinus rhythm, rate 64, left bundle branch block.  Labs show creatinine 1.0, potassium 4.0, magnesium 2.2, BNP 37, troponin 3 > 2.  Chest x-ray unremarkable.   Past Medical History:  Diagnosis Date   Allergy    ARTHRALGIA 03/07/2010   Qualifier: Diagnosis of  By: Elizebeth Koller MD, Mora Appl    Arthritis    Chest tightness 06/27/2020   Chicken pox    DOE (dyspnea on exertion) 06/27/2020   Headache    Left bundle branch block    Left bundle branch block (LBBB) 12/29/2016   Mixed dyslipidemia 10/28/2019   Non-traumatic mid back pain 04/24/2016   Syncope 11/28/2016    Past Surgical History:  Procedure Laterality Date   WRIST SURGERY  2003   broken hand/wrist     Medications Prior to Admission: Prior to Admission medications   Medication Sig Start Date End Date Taking? Authorizing Provider  aspirin EC 81 MG tablet Take 1 tablet (81 mg total) by mouth daily. Swallow whole. 09/05/20   Revankar, Reita Cliche, MD  diphenhydrAMINE (BENADRYL) 25 MG tablet  Take 25 mg by mouth every 6 (six) hours as needed for allergies.    [provider]  ibuprofen (ADVIL,MOTRIN) 800 MG tablet Take 800 mg by mouth every 8 (eight) hours as needed for headache.    [provider]  loratadine (CLARITIN) 10 MG tablet Take 10 mg by mouth daily as needed for allergies.    [provider]  metoprolol succinate (TOPROL XL) 25 MG 24 hr tablet Take 1 tablet (25 mg total) by mouth daily. 09/10/20   Revankar, Reita Cliche, MD  rosuvastatin (CRESTOR) 10 MG tablet Take 1 tablet (10 mg total) by mouth daily.  09/05/20 12/04/20  Revankar, Reita Cliche, MD     Allergies:   No Known Allergies  Social History:   Social History   Socioeconomic History   Marital status: Married    Spouse name: Not on file   Number of children: 1   Years of education: PhD   Highest education level: Not on file  Occupational History   Occupation: professor of chemistry    Employer: [employer]  Tobacco Use   Smoking status: Never   Smokeless tobacco: Never  Vaping Use   Vaping Use: Never used  Substance and Sexual Activity   Alcohol use: Yes    Comment: 3 - 4 times per week    Drug use: No   Sexual activity: Not on file  Other Topics Concern   Not on file  Social History Narrative   Lives with wife and child in a one story home.  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  Education: Phd   Social Determinants of Radio broadcast assistant Strain: Not on file  Food Insecurity: Not on file  Transportation Needs: Not on file  Physical Activity: Not on file  Stress: Not on file  Social Connections: Not on file  Intimate Partner Violence: Not on file    Family History:   The patient's family history includes Arthritis in his father and paternal grandmother; Cancer (age of onset: 41) in his father; Diabetes in his mother; Heart disease in his paternal grandmother; Hyperlipidemia in his mother; Hypertension in his paternal grandmother; Stroke in his maternal grandmother.    ROS:  Please see the history of present illness.  All other ROS reviewed and negative.     Physical Exam/Data:   Vitals:   09/17/20 1730 09/17/20 1815 09/17/20 1904 09/17/20 1913  BP:    117/82  Pulse: 60 (!) 57 60 61  Resp:   (!) 22 17  Temp:   98 F (36.7 C)   TempSrc:   Oral   SpO2:   99% 100%  Weight:      Height:       No intake or output data in the 24 hours ending 09/17/20 1920 Last 3 Weights 09/17/2020 09/05/2020 06/27/2020  Weight (lbs) 200 lb 204 lb 3.2 oz 213 lb 3.2 oz  Weight (kg) 90.719 kg 92.625 kg 96.707 kg      Body mass index is 29.53 kg/m.  General:  Well nourished, well developed, in no acute distress HEENT: normal Neck: + JVD Cardiac:  normal S1, S2; bradycardic, regular; no murmur  Lungs:  clear to auscultation bilaterally, no wheezing, rhonchi or rales  Abd: soft, nontender, no hepatomegaly  Ext: no edema Musculoskeletal:  No deformities, BUE and BLE strength normal and equal Skin: warm and dry  Neuro:  no focal abnormalities noted Psych:  Normal affect    EKG:  The ECG  that was done 09/17/20 was personally reviewed and demonstrates normal sinus rhythm, rate 64, left bundle branch block  Telemetry: Normal sinus rhythm with rate 50s to 60  Cardiac monitor:       Relevant CV Studies:   Laboratory Data:  High Sensitivity Troponin:   Recent Labs  Lab 09/17/20 1725  TROPONINIHS 3      Chemistry Recent Labs  Lab 09/17/20 1725  NA 138  K 4.0  CL 105  CO2 26  GLUCOSE 81  BUN 9  CREATININE 1.02  CALCIUM 9.4  GFRNONAA >60  ANIONGAP 7    Recent Labs  Lab 09/17/20 1725  PROT 7.1  ALBUMIN 4.1  AST 28  ALT 31  ALKPHOS 49  BILITOT 1.1   HematologyNo results for input(s): WBC, RBC, HGB, HCT, MCV, MCH, MCHC, RDW, PLT in the last 168 hours. BNP Recent Labs  Lab 09/17/20 1725  BNP 37.1    DDimer No results for input(s): DDIMER in the last 168 hours.   Radiology/Studies:  DG Chest Portable 1 View  Result Date: 09/17/2020 CLINICAL DATA:  Syncope, found down EXAM: PORTABLE CHEST 1 VIEW COMPARISON:  08/27/2017 FINDINGS: Single frontal view of the chest demonstrates cardiac monitor overlying the cardiac silhouette. No airspace disease, effusion, or pneumothorax. No acute bony abnormalities. IMPRESSION: 1. No acute intrathoracic process. Electronically Signed   By: Randa Ngo M.D.   On: 09/17/2020 17:48     Assessment and Plan:   Syncope: Has been followed with Dr. Geraldo Pitter for syncope, has had 4 episodes since 2019.  Last week was noted to have NSVT on  [employer] monitor, was started on metoprolol 12.5 mg BID.  Had syncopal episode today while wearing [employer] monitor, showed complete heart block with ventricular escape -Hold metoprolol.  Notably, was only taking 12.5 mg BID and had 3 similar syncopal episodes over last few years not on any AV nodal blockers -Recent echo was read as no LVH, but on my review it looks like moderate asymmetric basal septal hypertrophy.  Would recommend cardiac MRI tomorrow to evaluate for HCM -Consult EP tomorrow for PPM evaluation (vs ICD if HCM confirmed on MRI, given he has also been having NSVT on monitor)  CAD: coronary CTA on 08/15/2020, which showed nonobstructive CAD with calcium score 17 (78th percentile), calcified plaque in mid LAD and proximal D1 causing mild (25-39%) stenosis.   -Continue ASA, statin  -Diet heart healthy -DVT PPx: SCDs -Code: Full    Severity of Illness: The appropriate patient status for this patient is INPATIENT. Inpatient status is judged to be reasonable and necessary in order to provide the required intensity of service to ensure the patient's safety. The patient's presenting symptoms, physical exam findings, and initial radiographic and laboratory data in the context of their chronic comorbidities is felt to place them at high risk for further clinical deterioration. Furthermore, it is not anticipated that the patient will be medically stable for discharge from the hospital within 2 midnights of admission. The following factors support the patient status of inpatient.   " The patient's presenting symptoms include Syncope. " The worrisome physical exam findings include Bradycardia. " The initial radiographic and laboratory data are worrisome because of Complete heart block on cardiac monitor. " The chronic co-morbidities include CAD.   * I certify that at the point of admission it is my clinical judgment that the patient will require inpatient hospital care spanning beyond 2  midnights from the point of admission due to high intensity  of service, high risk for further deterioration and high frequency of surveillance required.*   For questions or updates, please contact [place] Please consult [email or web address] for contact info under     Signed, Donato Heinz, MD  09/17/2020 7:20 PM

## 2020-09-17 NOTE — ED Notes (Signed)
Patient reports he has been wearing a monitor for a little more than a week. States having syncopal episodes over a year ago -worse in summer months.

## 2020-09-17 NOTE — ED Provider Notes (Signed)
[facility] EMERGENCY DEPARTMENT Provider Note   CSN: [id number] Arrival date & time: 09/17/20  1512     History Chief Complaint  Patient presents with   Loss of Consciousness    Joel Lynch is a 50 y.o. male.  Patient is a 50 year old male past medical history as listed below presenting for complaints of syncope.  Patient states that approximately 11:30 AM today he was walking to his truck after working outside in the heat when he had tunnel vision and loss of consciousness.  Patient admits to awaking on the concrete.  Unknown head trauma.  Unknown downtime.  Denies blood thinner use.  Denies repeat syncope or neurological dysfunction.  Patient denies any recent use, chills, URI symptoms.  Denies any chest pain or shortness of breath.  Denies any previous history of PE or DVT.  Patient admits to prior history of syncope x3 times in the last year currently seeing cardiologist Dr. Geraldo Pitter with heart monitor placement.  States he was called by his cardiology office last week for nonsustained V. tach and was started on metoprolol 12.5 mg bid.  Patient states he is compliant with his medications.  Admits to resolution of all symptoms at this time.     The history is provided by the patient. No language interpreter was used.  Loss of Consciousness Episode history:  Single Most recent episode:  Today Progression:  Resolved Chronicity:  Recurrent Associated symptoms: no chest pain, no fever, no palpitations, no seizures, no shortness of breath and no vomiting       Past Medical History:  Diagnosis Date   Allergy    ARTHRALGIA 03/07/2010   Qualifier: Diagnosis of  By: Elizebeth Koller MD, Mora Appl    Arthritis    Chest tightness 06/27/2020   Chicken pox    DOE (dyspnea on exertion) 06/27/2020   Headache    Left bundle branch block    Left bundle branch block (LBBB) 12/29/2016   Mixed dyslipidemia 10/28/2019   Non-traumatic mid back pain 04/24/2016   Syncope 11/28/2016     Patient Active Problem List   Diagnosis Date Noted   Coronary artery disease 09/05/2020   Nonsustained ventricular tachycardia ([place]) 09/05/2020   Headache 09/04/2020   DOE (dyspnea on exertion) 06/27/2020   Chest tightness 06/27/2020   Allergy    Arthritis    Chicken pox    Left bundle branch block    Mixed dyslipidemia 10/28/2019   Left bundle branch block (LBBB) 12/29/2016   Syncope 11/28/2016   Non-traumatic mid back pain 04/24/2016   ARTHRALGIA 03/07/2010    Past Surgical History:  Procedure Laterality Date   WRIST SURGERY  2003   broken hand/wrist       Family History  Problem Relation Age of Onset   Hyperlipidemia Mother    Diabetes Mother    Arthritis Father    Cancer Father 73       hodgkins   Stroke Maternal Grandmother    Arthritis Paternal Grandmother    Hypertension Paternal Grandmother    Heart disease Paternal Grandmother     Social History   Tobacco Use   Smoking status: Never   Smokeless tobacco: Never  Vaping Use   Vaping Use: Never used  Substance Use Topics   Alcohol use: Yes    Comment: 3 - 4 times per week    Drug use: No    Home Medications Prior to Admission medications   Medication Sig Start Date End Date Taking? Authorizing  Provider  aspirin EC 81 MG tablet Take 1 tablet (81 mg total) by mouth daily. Swallow whole. 09/05/20   Revankar, Reita Cliche, MD  diphenhydrAMINE (BENADRYL) 25 MG tablet Take 25 mg by mouth every 6 (six) hours as needed for allergies.    [provider]  ibuprofen (ADVIL,MOTRIN) 800 MG tablet Take 800 mg by mouth every 8 (eight) hours as needed for headache.    [provider]  loratadine (CLARITIN) 10 MG tablet Take 10 mg by mouth daily as needed for allergies.    [provider]  metoprolol succinate (TOPROL XL) 25 MG 24 hr tablet Take 1 tablet (25 mg total) by mouth daily. 09/10/20   Revankar, Reita Cliche, MD  rosuvastatin (CRESTOR) 10 MG tablet Take 1 tablet (10 mg total) by mouth  daily. 09/05/20 12/04/20  Revankar, Reita Cliche, MD    Allergies    Patient has no known allergies.  Review of Systems   Review of Systems  Constitutional:  Negative for chills and fever.  HENT:  Negative for ear pain and sore throat.   Eyes:  Positive for visual disturbance (tunnel vision prior to syncope). Negative for pain.  Respiratory:  Negative for cough and shortness of breath.   Cardiovascular:  Positive for syncope. Negative for chest pain and palpitations.  Gastrointestinal:  Negative for abdominal pain and vomiting.  Genitourinary:  Negative for dysuria and hematuria.  Musculoskeletal:  Negative for arthralgias and back pain.  Skin:  Negative for color change and rash.  Neurological:  Positive for syncope. Negative for seizures.  All other systems reviewed and are negative.  Physical Exam Updated Vital Signs BP 129/81 (BP Location: Left Arm)   Pulse 63   Temp 98.4 F (36.9 C) (Oral)   Resp 20   Ht '5\' 9"'$  (1.753 m)   Wt 90.7 kg   SpO2 100%   BMI 29.53 kg/m   Physical Exam Vitals and nursing note reviewed.  Constitutional:      Appearance: He is well-developed.  HENT:     Head: Normocephalic and atraumatic.  Eyes:     Conjunctiva/sclera: Conjunctivae normal.  Cardiovascular:     Rate and Rhythm: Normal rate and regular rhythm.     Heart sounds: No murmur heard. Pulmonary:     Effort: Pulmonary effort is normal. No respiratory distress.     Breath sounds: Normal breath sounds.  Abdominal:     Palpations: Abdomen is soft.     Tenderness: There is no abdominal tenderness.  Musculoskeletal:     Cervical back: Neck supple.  Skin:    General: Skin is warm and dry.  Neurological:     General: No focal deficit present.     Mental Status: He is alert and oriented to person, place, and time. Mental status is at baseline.     GCS: GCS eye subscore is 4. GCS verbal subscore is 5. GCS motor subscore is 6.     Sensory: Sensation is intact.     Motor: Motor function is  intact.    ED Results / Procedures / Treatments   Labs (all labs ordered are listed, but only abnormal results are displayed) Labs Reviewed  BASIC METABOLIC PANEL  COMPREHENSIVE METABOLIC PANEL  MAGNESIUM  BRAIN NATRIURETIC PEPTIDE  TROPONIN I (HIGH SENSITIVITY)    EKG None  Radiology No results found.  Procedures Procedures   Medications Ordered in ED Medications - No data to display  ED Course  I have reviewed the triage vital signs  and the nursing notes.  Pertinent labs & imaging results that were available during my care of the patient were reviewed by me and considered in my medical decision making (see chart for details).    MDM Rules/Calculators/A&P                           50 year old male past medical history as listed below presenting for complaints of syncope with hx of syncope x 2 in the past year with heart monitor and under the care of cardiologist Dr. Geraldo Pitter. Chart review demonstrates patient phone call encounter today from cardiologist office recommending patient go to ED for "[employer]: report showed tachycardia then 3rd degree heart block, bradycardia and ended with sinus rhythm". Patient also notified by office last week for nonsustained V. tach started on metoprolol 12.5 mg twice daily.   On exam patient is alert oriented x3, no acute distress, afebrile, stable vital signs.  No abnormal movements or seizure-like activity.  EKG stable with a left bundle branch block as seen on previous EKG in July 2022.  New first-degree heart block with prolonged PR interval of 150 ms seen.  No delta waves.  No ST segment elevation or depression.  Stable troponins, BNP, and chest x-ray.  Patient is PERC negative.  Low suspicion PE.  Stable electrolytes.  Patient normally sees cardiologist Dr. Johnnye Sima. I spoke with his partner Dr.Shumann who was able to recover patient's holter monitor records demonstrating third degree heart block today.  Patient recommend for  admission and agreeable to plan.    Final Clinical Impression(s) / ED Diagnoses Final diagnoses:  Syncope, unspecified syncope type  Third degree heart block [facility])    Rx / DC Orders ED Discharge Orders     None        Lianne Cure, DO [id number]

## 2020-09-18 ENCOUNTER — Observation Stay (HOSPITAL_COMMUNITY): Payer: BC Managed Care – PPO

## 2020-09-18 ENCOUNTER — Encounter (HOSPITAL_COMMUNITY): Payer: Self-pay | Admitting: Cardiology

## 2020-09-18 ENCOUNTER — Inpatient Hospital Stay (HOSPITAL_COMMUNITY)
Admission: EM | Disposition: A | Discharge status: Home / Self Care | Payer: Self-pay | Source: Home / Self Care | Attending: Cardiology

## 2020-09-18 DIAGNOSIS — I2510 Atherosclerotic heart disease of native coronary artery without angina pectoris: Secondary | ICD-10-CM | POA: Diagnosis present

## 2020-09-18 DIAGNOSIS — I471 Supraventricular tachycardia: Secondary | ICD-10-CM | POA: Diagnosis present

## 2020-09-18 DIAGNOSIS — Z7982 Long term (current) use of aspirin: Secondary | ICD-10-CM | POA: Diagnosis not present

## 2020-09-18 DIAGNOSIS — Z823 Family history of stroke: Secondary | ICD-10-CM | POA: Diagnosis not present

## 2020-09-18 DIAGNOSIS — R55 Syncope and collapse: Secondary | ICD-10-CM | POA: Diagnosis present

## 2020-09-18 DIAGNOSIS — E782 Mixed hyperlipidemia: Secondary | ICD-10-CM | POA: Diagnosis present

## 2020-09-18 DIAGNOSIS — I442 Atrioventricular block, complete: Secondary | ICD-10-CM | POA: Diagnosis present

## 2020-09-18 DIAGNOSIS — Z8249 Family history of ischemic heart disease and other diseases of the circulatory system: Secondary | ICD-10-CM | POA: Diagnosis not present

## 2020-09-18 DIAGNOSIS — Z20822 Contact with and (suspected) exposure to covid-19: Secondary | ICD-10-CM | POA: Diagnosis present

## 2020-09-18 DIAGNOSIS — Z79899 Other long term (current) drug therapy: Secondary | ICD-10-CM | POA: Diagnosis not present

## 2020-09-18 DIAGNOSIS — Z807 Family history of other malignant neoplasms of lymphoid, hematopoietic and related tissues: Secondary | ICD-10-CM | POA: Diagnosis not present

## 2020-09-18 HISTORY — PX: PACEMAKER IMPLANT: EP1218

## 2020-09-18 LAB — CBC
HCT: 44.4 % (ref 39.0–52.0)
Hemoglobin: 15.2 g/dL (ref 13.0–17.0)
MCH: 33.6 pg (ref 26.0–34.0)
MCHC: 34.2 g/dL (ref 30.0–36.0)
MCV: 98.2 fL (ref 80.0–100.0)
Platelets: 166 10*3/uL (ref 150–400)
RBC: 4.52 MIL/uL (ref 4.22–5.81)
RDW: 12.1 % (ref 11.5–15.5)
WBC: 5.2 10*3/uL (ref 4.0–10.5)
nRBC: 0 % (ref 0.0–0.2)

## 2020-09-18 LAB — BASIC METABOLIC PANEL
Anion gap: 6 (ref 5–15)
BUN: 8 mg/dL (ref 6–20)
CO2: 23 mmol/L (ref 22–32)
Calcium: 8.8 mg/dL — ABNORMAL LOW (ref 8.9–10.3)
Chloride: 109 mmol/L (ref 98–111)
Creatinine, Ser: 0.9 mg/dL (ref 0.61–1.24)
GFR, Estimated: >60 mL/min (ref >60)
Glucose, Bld: 77 mg/dL (ref 70–99)
Potassium: 3.7 mmol/L (ref 3.5–5.1)
Sodium: 138 mmol/L (ref 135–145)

## 2020-09-18 LAB — APTT: aPTT: 29 seconds (ref 24–36)

## 2020-09-18 LAB — PROTIME-INR
INR: 1.1 (ref 0.8–1.2)
Prothrombin Time: 13.9 seconds (ref 11.4–15.2)

## 2020-09-18 LAB — SARS CORONAVIRUS 2 (TAT 6-24 HRS): SARS Coronavirus 2: NEGATIVE

## 2020-09-18 LAB — TSH: TSH: 3.382 u[IU]/mL (ref 0.350–4.500)

## 2020-09-18 LAB — GLUCOSE, CAPILLARY: Glucose-Capillary: 140 mg/dL — ABNORMAL HIGH (ref 70–99)

## 2020-09-18 SURGERY — PACEMAKER IMPLANT

## 2020-09-18 MED ORDER — SODIUM CHLORIDE 0.9% FLUSH
3.0000 mL | Freq: Two times a day (BID) | INTRAVENOUS | Status: DC
  Administered 2020-09-18 – 2020-09-19 (×2): 3 mL via INTRAVENOUS

## 2020-09-18 MED ORDER — SODIUM CHLORIDE 0.9 % IV SOLN: 250.0000 mL | INTRAVENOUS | Status: DC | PRN

## 2020-09-18 MED ORDER — SODIUM CHLORIDE 0.9% FLUSH: 3.0000 mL | INTRAVENOUS | Status: DC | PRN

## 2020-09-18 MED ORDER — HYDROCODONE-ACETAMINOPHEN 5-325 MG PO TABS
1.0000 | ORAL_TABLET | ORAL | Status: DC | PRN
  Administered 2020-09-18: 1 via ORAL
  Filled 2020-09-18 (×2): qty 1

## 2020-09-18 MED ORDER — IOHEXOL 350 MG/ML SOLN
INTRAVENOUS | Status: DC | PRN
  Administered 2020-09-18: 10 mL

## 2020-09-18 MED ORDER — SODIUM CHLORIDE 0.9 % IV SOLN
INTRAVENOUS | Status: AC
  Filled 2020-09-18: qty 2

## 2020-09-18 MED ORDER — CHLORHEXIDINE GLUCONATE 4 % EX LIQD
60.0000 mL | Freq: Once | CUTANEOUS | Status: DC
  Filled 2020-09-18: qty 60

## 2020-09-18 MED ORDER — CEFAZOLIN SODIUM-DEXTROSE 1-4 GM/50ML-% IV SOLN
1.0000 g | Freq: Four times a day (QID) | INTRAVENOUS | Status: AC
  Administered 2020-09-18 – 2020-09-19 (×3): 1 g via INTRAVENOUS
  Filled 2020-09-18 (×3): qty 50

## 2020-09-18 MED ORDER — SODIUM CHLORIDE 0.9 % IV SOLN
250.0000 mL | INTRAVENOUS | Status: DC
Start: 2020-09-18 — End: 2020-09-18

## 2020-09-18 MED ORDER — MIDAZOLAM HCL 5 MG/5ML IJ SOLN
INTRAMUSCULAR | Status: DC | PRN
  Administered 2020-09-18 (×2): 1 mg via INTRAVENOUS

## 2020-09-18 MED ORDER — CEFAZOLIN SODIUM-DEXTROSE 2-4 GM/100ML-% IV SOLN
2.0000 g | INTRAVENOUS | Status: AC
  Administered 2020-09-18: 2 g via INTRAVENOUS

## 2020-09-18 MED ORDER — LIDOCAINE HCL 1 % IJ SOLN
INTRAMUSCULAR | Status: AC
  Filled 2020-09-18: qty 60

## 2020-09-18 MED ORDER — GENTAMICIN SULFATE 40 MG/ML IJ SOLN
80.0000 mg | INTRAMUSCULAR | Status: AC
  Administered 2020-09-18: 80 mg
  Filled 2020-09-18: qty 2

## 2020-09-18 MED ORDER — SODIUM CHLORIDE 0.9 % IV SOLN: INTRAVENOUS | Status: DC

## 2020-09-18 MED ORDER — HEPARIN (PORCINE) IN NACL 1000-0.9 UT/500ML-% IV SOLN
INTRAVENOUS | Status: DC | PRN
  Administered 2020-09-18: 500 mL

## 2020-09-18 MED ORDER — MIDAZOLAM HCL 5 MG/5ML IJ SOLN
INTRAMUSCULAR | Status: AC
  Filled 2020-09-18: qty 5

## 2020-09-18 MED ORDER — GADOBUTROL 1 MMOL/ML IV SOLN
13.0000 mL | Freq: Once | INTRAVENOUS | Status: AC | PRN
  Administered 2020-09-18: 13 mL via INTRAVENOUS

## 2020-09-18 MED ORDER — HEPARIN (PORCINE) IN NACL 1000-0.9 UT/500ML-% IV SOLN
INTRAVENOUS | Status: AC
  Filled 2020-09-18: qty 500

## 2020-09-18 MED ORDER — CEFAZOLIN SODIUM-DEXTROSE 2-4 GM/100ML-% IV SOLN
INTRAVENOUS | Status: AC
  Filled 2020-09-18: qty 100

## 2020-09-18 MED ORDER — LIDOCAINE HCL (PF) 1 % IJ SOLN
INTRAMUSCULAR | Status: DC | PRN
  Administered 2020-09-18: 45 mL

## 2020-09-18 MED ORDER — SODIUM CHLORIDE 0.9% FLUSH
3.0000 mL | Freq: Two times a day (BID) | INTRAVENOUS | Status: DC
  Administered 2020-09-18: 3 mL via INTRAVENOUS

## 2020-09-18 MED ORDER — ONDANSETRON HCL 4 MG/2ML IJ SOLN: 4.0000 mg | Freq: Four times a day (QID) | INTRAMUSCULAR | Status: DC | PRN

## 2020-09-18 MED ORDER — ACETAMINOPHEN 325 MG PO TABS
325.0000 mg | ORAL_TABLET | ORAL | Status: DC | PRN
  Administered 2020-09-18: 325 mg via ORAL
  Administered 2020-09-19: 650 mg via ORAL
  Filled 2020-09-18 (×2): qty 2

## 2020-09-18 SURGICAL SUPPLY — 12 items
CABLE SURGICAL S-101-97-12 (CABLE) ×2 IMPLANT
CATH RIGHTSITE C315HIS02 (CATHETERS) ×1 IMPLANT
IPG PACE AZUR XT DR MRI W1DR01 (Pacemaker) IMPLANT
KIT MICROPUNCTURE NIT STIFF (SHEATH) ×1 IMPLANT
LEAD CAPSURE NOVUS 5076-52CM (Lead) ×1 IMPLANT
LEAD SELECT SECURE 3830 383069 (Lead) IMPLANT
PACE AZURE XT DR MRI W1DR01 (Pacemaker) ×2 IMPLANT
PAD PRO RADIOLUCENT 2001M-C (PAD) ×2 IMPLANT
SELECT SECURE 3830 383069 (Lead) ×2 IMPLANT
SHEATH 7FR PRELUDE SNAP 13 (SHEATH) ×2 IMPLANT
TRAY PACEMAKER INSERTION (PACKS) ×2 IMPLANT
WIRE HI TORQ VERSACORE-J 145CM (WIRE) ×1 IMPLANT

## 2020-09-18 NOTE — Consult Note (Addendum)
Cardiology Consultation:   Patient ID: [name] MRN: [id number]; DOB: 1970-11-28  Admit date: 09/17/2020 Date of Consult: 09/18/2020  PCP:  Martinique, [name], MD   [place] Providers Cardiologist:  Dr. Geraldo Lynch   Patient Profile:   Joel Lynch is a 50 y.o. male with a hx of CAD (non-obstructive by CT), HLD, obesity, LBBB who is being seen 09/18/2020 for the evaluation of CHB at the request of Dr. Percival Lynch.  History of Present Illness:   Joel Lynch last saw Dr. Geraldo Lynch 09/05/20, there was discussion of some dizzy spells no syncope (though does have history of syncope going back to 2019) or palpitations and a hx of 4beat NSVT remotely and with LBBB planned for monitoring.  There are phone notes that report to the monitor noted some NSVT again and he was started on Toprol 12.'5mg'$  BID on 09/10/20 with plans for EP consult.  He was admitted yesterday to [facility] for a syncopal episode, he was still wearing his monitor which noted an episode of CHB  His toprol held and he was admitted Dr. Gardiner Lynch noted  echo was read as no LVH, but on my review it looks like moderate asymmetric basal septal hypertrophy and planned for a c.MRI  LABS K+ 4.0 BUN/Creat 8/0.90 Mag 2.2 WBC 5.2 H/H 15/44 Plts 166 TSH 3.382  Home meds reviewed Only the Toprol 12.'5mg'$  BID noted for potential reversible causes   The patient reports that back in 2019 he had a fainting spell that prompted his intial visits with cardiology, and initially then suspect to have been low BP Since then he reports that he has had a nearly constant sense that hsomething is off, with a feeling like he is or could faint, though doesn't and moments of abrupt and fleeting sizzy spells hat he will need to grab onto something. He did not faint again until 3 mo ago, this the same, was walking started to feel lightheaded and weak and fainted. Woke feeling a bit startled, unsure of what happened but OK otherwise. No palpitations, no  CP Yesterday he was walking to his truck, started to feel lightheaded and leaned up on his truclk and woke on the ground This feeling just like the others.  He feels better when seated/latying down Has been trying for the last 3 months to stay really well hydrated noting  the hot months tend to make him feel worse and does not think that he coupld be dehydrated.  He has no particular palpitations, no CP  Work up has included an Echo with preserved LVEF, coronary CT with mild non-obstructive CAD with some degree of septal hypertrophy   MRI done today IMPRESSION: 1. Mild asymmetric LV hypertrophy measuring 69m in basal septum (815min posterior wall). Does not meet criteria for hypertrophic cardiomyopathy (wall thickness <1586m  2.  No late gadolinium enhancement to suggest myocardial scar   3. Normal LV size and low normal systolic function (EF [id number] septal motion consistent with left bundle branch block   4.  Normal RV size and systolic function (EF [id number] Medical History:  Diagnosis Date   Allergy    ARTHRALGIA 03/07/2010   Qualifier: Diagnosis of  By: Joel Lynch, Joel Lynch Arthritis    Chest tightness 06/27/2020   Chicken pox    DOE (dyspnea on exertion) 06/27/2020   Headache    Left bundle branch block    Left bundle branch block (LBBB) 12/29/2016   Mixed dyslipidemia  10/28/2019   Non-traumatic mid back pain 04/24/2016   Syncope 11/28/2016    Past Surgical History:  Procedure Laterality Date   WRIST SURGERY  2003   broken hand/wrist     Home Medications:  Prior to Admission medications   Medication Sig Start Date End Date Taking? Authorizing Provider  aspirin EC 81 MG tablet Take 1 tablet (81 mg total) by mouth daily. Swallow whole. 09/05/20  Yes Revankar, Joel Cliche, MD  diphenhydrAMINE (BENADRYL) 25 MG tablet Take 25 mg by mouth at bedtime as needed for allergies or sleep.   Yes [provider]  ibuprofen (ADVIL,MOTRIN) 800 MG tablet Take  800 mg by mouth every 8 (eight) hours as needed for headache.   Yes [provider]  loratadine (CLARITIN) 10 MG tablet Take 10 mg by mouth daily as needed for allergies.   Yes [provider]  metoprolol succinate (TOPROL XL) 25 MG 24 hr tablet Take 1 tablet (25 mg total) by mouth daily. Patient taking differently: Take 12.5 mg by mouth 2 (two) times daily. 09/10/20  Yes Revankar, Joel Cliche, MD  rosuvastatin (CRESTOR) 10 MG tablet Take 1 tablet (10 mg total) by mouth daily. 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  Education: Phd   Social Determinants of Radio broadcast assistant Strain: Not on file  Food Insecurity: Not on file  Transportation Needs: Not on file  Physical Activity: Not on file  Stress: Not on file  Social Connections: Not on file  Intimate Partner Violence: Not on file    Family History:   Family History  Problem Relation Age of Onset   Hyperlipidemia Mother    Diabetes Mother    Arthritis Father    Cancer Father 56       hodgkins   Stroke Maternal Grandmother    Arthritis Paternal  Grandmother    Hypertension Paternal Grandmother    Heart disease Paternal Grandmother      ROS:  Please see the history of present illness.  All other ROS reviewed and negative.     Physical Exam/Data:   Vitals:   09/18/20 0700 09/18/20 0730 09/18/20 0745 09/18/20 1126  BP: 99/63 108/66 103/75 104/81  Pulse: (!) 56 (!) 54 (!) 55 65  Resp: '16 17 16 '$ (!) 21  Temp:      TempSrc:      SpO2: 98% 98% 98% 100%  Weight:  Height:       No intake or output data in the 24 hours ending 09/18/20 1137 Last 3 Weights 09/17/2020 09/05/2020 06/27/2020  Weight (lbs) 200 lb 204 lb 3.2 oz 213 lb 3.2 oz  Weight (kg) 90.719 kg 92.625 kg 96.707 kg     Body mass index is 29.53 kg/m.  General:  Well nourished, well developed, in no acute distress HEENT: normal Lymph: no adenopathy Neck: no JVD Endocrine:  No thryomegaly Vascular: No carotid bruits  Cardiac:  ; RRR; no murmurs, gallops or rubs Lungs: CTA b/ly, no wheezing, rhonchi or rales  Abd: soft, nontender Ext: no edema Musculoskeletal:  No deformities Skin: warm and dry  Neuro:  no focal abnormalities noted Psych:  Normal affect   EKG:  The EKG was personally reviewed and demonstrates:    SR 64bpm, LBBB SB 59bpm, LBBB  OLD  Telemetry:  Telemetry was personally reviewed and demonstrates:   SB/SR, 50's-70's  Relevant CV Studies:   09/18/20: c.MRI IMPRESSION: 1. Mild asymmetric LV hypertrophy measuring 42m in basal septum (892min posterior wall). Does not meet criteria for hypertrophic cardiomyopathy (wall thickness <1562m  2.  No late gadolinium enhancement to suggest myocardial scar   3. Normal LV size and low normal systolic function (EF [id number] septal motion consistent with left bundle branch block   4.  Normal RV size and systolic function (EF 52%123456 09/03/20: TTE IMPRESSIONS   1. Left ventricular ejection fraction, by estimation, is 60 to 65%. The  left ventricle has normal function. The left  ventricle has no regional  wall motion abnormalities. Left ventricular diastolic parameters were  normal.   2. The mitral valve is normal in structure. Mild mitral valve  regurgitation. No evidence of mitral stenosis.   3. The aortic valve is normal in structure. Aortic valve regurgitation is  mild. No aortic stenosis is present.    08/15/20; Coronary CT IMPRESSION: 1. Coronary calcium score of 17. This was 78th percentile for age, sex, and race matched control.   2. Normal coronary origin with left dominance.   3. There is notable septal hypertrophy: 16 mm consider correlation with echocardiogram if clinically indicated.   4. CAD-RADS 2. Mild non-obstructive CAD (25-49%). Consider non-atherosclerotic causes of chest pain. Consider preventive therapy and risk factor modification.    Laboratory Data:  High Sensitivity Troponin:   Recent Labs  Lab 09/17/20 1725 09/17/20 1912  TROPONINIHS 3 2     Chemistry Recent Labs  Lab 09/17/20 1725 09/18/20 0500  NA 138 138  K 4.0 3.7  CL 105 109  CO2 26 23  GLUCOSE 81 77  BUN 9 8  CREATININE 1.02 0.90  CALCIUM 9.4 8.8*  GFRNONAA >60 >60  ANIONGAP 7 6    Recent Labs  Lab 09/17/20 1725  PROT 7.1  ALBUMIN 4.1  AST 28  ALT 31  ALKPHOS 49  BILITOT 1.1   Hematology Recent Labs  Lab 09/18/20 0500  WBC 5.2  RBC 4.52  HGB 15.2  HCT 44.4  MCV 98.2  MCH 33.6  MCHC 34.2  RDW 12.1  PLT 166   BNP Recent Labs  Lab 09/17/20 1725  BNP 37.1    DDimer No results for input(s): DDIMER in the last 168 hours.   Radiology/Studies:  DG Chest Portable 1 View  Result Date: 09/17/2020 CLINICAL DATA:  Syncope, found down EXAM: PORTABLE CHEST 1 VIEW COMPARISON:  08/27/2017 FINDINGS: Single frontal view of the chest demonstrates cardiac monitor  overlying the cardiac silhouette. No airspace disease, effusion, or pneumothorax. No acute bony abnormalities. IMPRESSION: 1. No acute intrathoracic process. Electronically Signed   By:  Randa Ngo M.D.   On: 09/17/2020 17:48     Assessment and Plan:   Syncope Yesterday was wearing a monitor associated with CHB, V pauses 4 > 4.6 >4.8 seconds. This the pt reports lines up time-wise with his syncope  He has another tracing labeled AFib, though this is unclear, only slightly irregular and at his baseline strip P waves are difficult to sees. A 3rd tracing looks like an SVT with a couplet and a 4beat NSVT.  He was started and was taking a very small toprol dose for a week including yesterday morning He has an SVT as well He has baseline conduction system disease with LBBB His dizzy spells and syncope also pre-date the metoprolol  I think he will need PPM implant for tachy-brady   I have discussed with the patient, he would be agreeable if Dr. Rayann Heman also feels is indicated. He is NPO.  I am not sure this explains his more persistant feeling of slight lightheadedness/feeling "off"      Risk Assessment/Risk Scores:     For questions or updates, please contact [place] Please consult [email or web address] for contact info under    Signed, Baldwin Jamaica, PA-C  09/18/2020 11:37 AM   I have seen, examined the patient, and reviewed the above assessment and plan.  Changes to above are made where necessary.  On exam, RRR.  The patient presents with syncope and transient AV block.  He has longstanding conduction system disease.  He has had prior syncope and presyncope.  No reversible causes have been found.  I would therefore recommend PPM implantation.  Cardiac MRI is reviewed.  Risks, benefits, alternatives to pacemaker implantation were discussed in detail with the patient today. The patient understands that the risks include but are not limited to bleeding, infection, pneumothorax, perforation, tamponade, vascular damage, renal failure, MI, stroke, death,  and lead dislodgement and wishes to proceed. We will therefore plan PPM later today.   Co Sign: Thompson Grayer, MD 09/18/2020 1:34 PM

## 2020-09-18 NOTE — Progress Notes (Signed)
Patient transferred from cath lab at 1800hrs.  Pressure dressing to left chest CDI.  Denies pain.  Oriented to unit and plan of care for shift.  Verbalized understanding.  Wife at bedside.

## 2020-09-18 NOTE — ED Notes (Signed)
Pt back from MRI 

## 2020-09-18 NOTE — ED Notes (Signed)
Patient transported to MRI 

## 2020-09-18 NOTE — H&P (View-Only) (Signed)
Cardiology Consultation:   Patient ID: [name] MRN: [id number]; DOB: 27-Jan-1971  Admit date: 09/17/2020 Date of Consult: 09/18/2020  PCP:  Martinique, [name], MD   [place] Providers Cardiologist:  Dr. Geraldo Pitter   Patient Profile:   Joel Lynch is a 50 y.o. male with a hx of CAD (non-obstructive by CT), HLD, obesity, LBBB who is being seen 09/18/2020 for the evaluation of CHB at the request of Dr. Percival Spanish.  History of Present Illness:   Joel Lynch last saw Dr. Geraldo Pitter 09/05/20, there was discussion of some dizzy spells no syncope (though does have history of syncope going back to 2019) or palpitations and a hx of 4beat NSVT remotely and with LBBB planned for monitoring.  There are phone notes that report to the monitor noted some NSVT again and he was started on Toprol 12.'5mg'$  BID on 09/10/20 with plans for EP consult.  He was admitted yesterday to [facility] for a syncopal episode, he was still wearing his monitor which noted an episode of CHB  His toprol held and he was admitted Dr. Gardiner Rhyme noted  echo was read as no LVH, but on my review it looks like moderate asymmetric basal septal hypertrophy and planned for a c.MRI  LABS K+ 4.0 BUN/Creat 8/0.90 Mag 2.2 WBC 5.2 H/H 15/44 Plts 166 TSH 3.382  Home meds reviewed Only the Toprol 12.'5mg'$  BID noted for potential reversible causes   The patient reports that back in 2019 he had a fainting spell that prompted his intial visits with cardiology, and initially then suspect to have been low BP Since then he reports that he has had a nearly constant sense that hsomething is off, with a feeling like he is or could faint, though doesn't and moments of abrupt and fleeting sizzy spells hat he will need to grab onto something. He did not faint again until 3 mo ago, this the same, was walking started to feel lightheaded and weak and fainted. Woke feeling a bit startled, unsure of what happened but OK otherwise. No palpitations, no  CP Yesterday he was walking to his truck, started to feel lightheaded and leaned up on his truclk and woke on the ground This feeling just like the others.  He feels better when seated/latying down Has been trying for the last 3 months to stay really well hydrated noting  the hot months tend to make him feel worse and does not think that he coupld be dehydrated.  He has no particular palpitations, no CP  Work up has included an Echo with preserved LVEF, coronary CT with mild non-obstructive CAD with some degree of septal hypertrophy   MRI done today IMPRESSION: 1. Mild asymmetric LV hypertrophy measuring 16m in basal septum (860min posterior wall). Does not meet criteria for hypertrophic cardiomyopathy (wall thickness <1561m  2.  No late gadolinium enhancement to suggest myocardial scar   3. Normal LV size and low normal systolic function (EF [id number] septal motion consistent with left bundle branch block   4.  Normal RV size and systolic function (EF [id number] Medical History:  Diagnosis Date   Allergy    ARTHRALGIA 03/07/2010   Qualifier: Diagnosis of  By: HodElizebeth Koller, ThoMora Appl Arthritis    Chest tightness 06/27/2020   Chicken pox    DOE (dyspnea on exertion) 06/27/2020   Headache    Left bundle branch block    Left bundle branch block (LBBB) 12/29/2016   Mixed dyslipidemia  10/28/2019   Non-traumatic mid back pain 04/24/2016   Syncope 11/28/2016    Past Surgical History:  Procedure Laterality Date   WRIST SURGERY  2003   broken hand/wrist     Home Medications:  Prior to Admission medications   Medication Sig Start Date End Date Taking? Authorizing Provider  aspirin EC 81 MG tablet Take 1 tablet (81 mg total) by mouth daily. Swallow whole. 09/05/20  Yes Revankar, Reita Cliche, MD  diphenhydrAMINE (BENADRYL) 25 MG tablet Take 25 mg by mouth at bedtime as needed for allergies or sleep.   Yes [provider]  ibuprofen (ADVIL,MOTRIN) 800 MG tablet Take  800 mg by mouth every 8 (eight) hours as needed for headache.   Yes [provider]  loratadine (CLARITIN) 10 MG tablet Take 10 mg by mouth daily as needed for allergies.   Yes [provider]  metoprolol succinate (TOPROL XL) 25 MG 24 hr tablet Take 1 tablet (25 mg total) by mouth daily. Patient taking differently: Take 12.5 mg by mouth 2 (two) times daily. 09/10/20  Yes Revankar, Reita Cliche, MD  rosuvastatin (CRESTOR) 10 MG tablet Take 1 tablet (10 mg total) by mouth daily. 09/05/20 12/04/20 Yes Revankar, Reita Cliche, MD    Inpatient Medications: Scheduled Meds:  aspirin EC  81 mg Oral Daily   rosuvastatin  10 mg Oral Daily   Continuous Infusions:  PRN Meds:   Allergies:   No Known Allergies  Social History:   Social History   Socioeconomic History   Marital status: Married    Spouse name: Not on file   Number of children: 1   Years of education: PhD   Highest education level: Not on file  Occupational History   Occupation: professor of Doctor, hospital: [employer]  Tobacco Use   Smoking status: Never   Smokeless tobacco: Never  Vaping Use   Vaping Use: Never used  Substance and Sexual Activity   Alcohol use: Yes    Comment: 3 - 4 times per week    Drug use: No   Sexual activity: Not on file  Other Topics Concern   Not on file  Social History Narrative   Lives with wife and child in a one story home.  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  Education: Phd   Social Determinants of Radio broadcast assistant Strain: Not on file  Food Insecurity: Not on file  Transportation Needs: Not on file  Physical Activity: Not on file  Stress: Not on file  Social Connections: Not on file  Intimate Partner Violence: Not on file    Family History:   Family History  Problem Relation Age of Onset   Hyperlipidemia Mother    Diabetes Mother    Arthritis Father    Cancer Father 45       hodgkins   Stroke Maternal Grandmother    Arthritis Paternal  Grandmother    Hypertension Paternal Grandmother    Heart disease Paternal Grandmother      ROS:  Please see the history of present illness.  All other ROS reviewed and negative.     Physical Exam/Data:   Vitals:   09/18/20 0700 09/18/20 0730 09/18/20 0745 09/18/20 1126  BP: 99/63 108/66 103/75 104/81  Pulse: (!) 56 (!) 54 (!) 55 65  Resp: '16 17 16 '$ (!) 21  Temp:      TempSrc:      SpO2: 98% 98% 98% 100%  Weight:  Height:       No intake or output data in the 24 hours ending 09/18/20 1137 Last 3 Weights 09/17/2020 09/05/2020 06/27/2020  Weight (lbs) 200 lb 204 lb 3.2 oz 213 lb 3.2 oz  Weight (kg) 90.719 kg 92.625 kg 96.707 kg     Body mass index is 29.53 kg/m.  General:  Well nourished, well developed, in no acute distress HEENT: normal Lymph: no adenopathy Neck: no JVD Endocrine:  No thryomegaly Vascular: No carotid bruits  Cardiac:  ; RRR; no murmurs, gallops or rubs Lungs: CTA b/ly, no wheezing, rhonchi or rales  Abd: soft, nontender Ext: no edema Musculoskeletal:  No deformities Skin: warm and dry  Neuro:  no focal abnormalities noted Psych:  Normal affect   EKG:  The EKG was personally reviewed and demonstrates:    SR 64bpm, LBBB SB 59bpm, LBBB  OLD  Telemetry:  Telemetry was personally reviewed and demonstrates:   SB/SR, 50's-70's  Relevant CV Studies:   09/18/20: c.MRI IMPRESSION: 1. Mild asymmetric LV hypertrophy measuring 57m in basal septum (834min posterior wall). Does not meet criteria for hypertrophic cardiomyopathy (wall thickness <1581m  2.  No late gadolinium enhancement to suggest myocardial scar   3. Normal LV size and low normal systolic function (EF [id number] septal motion consistent with left bundle branch block   4.  Normal RV size and systolic function (EF 52%123456 09/03/20: TTE IMPRESSIONS   1. Left ventricular ejection fraction, by estimation, is 60 to 65%. The  left ventricle has normal function. The left  ventricle has no regional  wall motion abnormalities. Left ventricular diastolic parameters were  normal.   2. The mitral valve is normal in structure. Mild mitral valve  regurgitation. No evidence of mitral stenosis.   3. The aortic valve is normal in structure. Aortic valve regurgitation is  mild. No aortic stenosis is present.    08/15/20; Coronary CT IMPRESSION: 1. Coronary calcium score of 17. This was 78th percentile for age, sex, and race matched control.   2. Normal coronary origin with left dominance.   3. There is notable septal hypertrophy: 16 mm consider correlation with echocardiogram if clinically indicated.   4. CAD-RADS 2. Mild non-obstructive CAD (25-49%). Consider non-atherosclerotic causes of chest pain. Consider preventive therapy and risk factor modification.    Laboratory Data:  High Sensitivity Troponin:   Recent Labs  Lab 09/17/20 1725 09/17/20 1912  TROPONINIHS 3 2     Chemistry Recent Labs  Lab 09/17/20 1725 09/18/20 0500  NA 138 138  K 4.0 3.7  CL 105 109  CO2 26 23  GLUCOSE 81 77  BUN 9 8  CREATININE 1.02 0.90  CALCIUM 9.4 8.8*  GFRNONAA >60 >60  ANIONGAP 7 6    Recent Labs  Lab 09/17/20 1725  PROT 7.1  ALBUMIN 4.1  AST 28  ALT 31  ALKPHOS 49  BILITOT 1.1   Hematology Recent Labs  Lab 09/18/20 0500  WBC 5.2  RBC 4.52  HGB 15.2  HCT 44.4  MCV 98.2  MCH 33.6  MCHC 34.2  RDW 12.1  PLT 166   BNP Recent Labs  Lab 09/17/20 1725  BNP 37.1    DDimer No results for input(s): DDIMER in the last 168 hours.   Radiology/Studies:  DG Chest Portable 1 View  Result Date: 09/17/2020 CLINICAL DATA:  Syncope, found down EXAM: PORTABLE CHEST 1 VIEW COMPARISON:  08/27/2017 FINDINGS: Single frontal view of the chest demonstrates cardiac monitor  overlying the cardiac silhouette. No airspace disease, effusion, or pneumothorax. No acute bony abnormalities. IMPRESSION: 1. No acute intrathoracic process. Electronically Signed   By:  Randa Ngo M.D.   On: 09/17/2020 17:48     Assessment and Plan:   Syncope Yesterday was wearing a monitor associated with CHB, V pauses 4 > 4.6 >4.8 seconds. This the pt reports lines up time-wise with his syncope  He has another tracing labeled AFib, though this is unclear, only slightly irregular and at his baseline strip P waves are difficult to sees. A 3rd tracing looks like an SVT with a couplet and a 4beat NSVT.  He was started and was taking a very small toprol dose for a week including yesterday morning He has an SVT as well He has baseline conduction system disease with LBBB His dizzy spells and syncope also pre-date the metoprolol  I think he will need PPM implant for tachy-brady   I have discussed with the patient, he would be agreeable if Dr. Rayann Heman also feels is indicated. He is NPO.  I am not sure this explains his more persistant feeling of slight lightheadedness/feeling "off"      Risk Assessment/Risk Scores:     For questions or updates, please contact [place] Please consult [email or web address] for contact info under    Signed, Baldwin Jamaica, PA-C  09/18/2020 11:37 AM   I have seen, examined the patient, and reviewed the above assessment and plan.  Changes to above are made where necessary.  On exam, RRR.  The patient presents with syncope and transient AV block.  He has longstanding conduction system disease.  He has had prior syncope and presyncope.  No reversible causes have been found.  I would therefore recommend PPM implantation.  Cardiac MRI is reviewed.  Risks, benefits, alternatives to pacemaker implantation were discussed in detail with the patient today. The patient understands that the risks include but are not limited to bleeding, infection, pneumothorax, perforation, tamponade, vascular damage, renal failure, MI, stroke, death,  and lead dislodgement and wishes to proceed. We will therefore plan PPM later today.   Co Sign: Thompson Grayer, MD 09/18/2020 1:34 PM

## 2020-09-18 NOTE — Progress Notes (Signed)
   Progress Note  Patient Name: Joel Lynch Date of Encounter: 09/18/2020  Primary Cardiologist:   None   Subjective   No chest pain.  No SOB.   Inpatient Medications    Scheduled Meds:  aspirin EC  81 mg Oral Daily   rosuvastatin  10 mg Oral Daily   Continuous Infusions:  PRN Meds:    Vital Signs    Vitals:   09/18/20 0130 09/18/20 0330 09/18/20 0700 09/18/20 0730  BP: 109/78 100/71 99/63 108/66  Pulse: (!) 49 (!) 52 (!) 56 (!) 54  Resp: '16 15 16 17  '$ Temp:      TempSrc:      SpO2: 96% 96% 98% 98%  Weight:      Height:       No intake or output data in the 24 hours ending 09/18/20 0739 Filed Weights   09/17/20 1624  Weight: 90.7 kg    Telemetry    NSR, SB - Personally Reviewed  ECG    NA - Personally Reviewed  Physical Exam   GEN: No acute distress.   Neck: No  JVD Cardiac: RRR, no murmurs, rubs, or gallops.  Respiratory: Clear to auscultation bilaterally. GI: Soft, nontender, non-distended  MS: No  edema; No deformity. Neuro:  Nonfocal  Psych: Normal affect   Labs    Chemistry Recent Labs  Lab 09/17/20 1725 09/18/20 0500  NA 138 138  K 4.0 3.7  CL 105 109  CO2 26 23  GLUCOSE 81 77  BUN 9 8  CREATININE 1.02 0.90  CALCIUM 9.4 8.8*  PROT 7.1  --   ALBUMIN 4.1  --   AST 28  --   ALT 31  --   ALKPHOS 49  --   BILITOT 1.1  --   GFRNONAA >60 >60  ANIONGAP 7 6     Hematology Recent Labs  Lab 09/18/20 0500  WBC 5.2  RBC 4.52  HGB 15.2  HCT 44.4  MCV 98.2  MCH 33.6  MCHC 34.2  RDW 12.1  PLT 166    Cardiac EnzymesNo results for input(s): TROPONINI in the last 168 hours. No results for input(s): TROPIPOC in the last 168 hours.   BNP Recent Labs  Lab 09/17/20 1725  BNP 37.1     DDimer No results for input(s): DDIMER in the last 168 hours.   Radiology    DG Chest Portable 1 View  Result Date: 09/17/2020 CLINICAL DATA:  Syncope, found down EXAM: PORTABLE CHEST 1 VIEW COMPARISON:  08/27/2017 FINDINGS: Single  frontal view of the chest demonstrates cardiac monitor overlying the cardiac silhouette. No airspace disease, effusion, or pneumothorax. No acute bony abnormalities. IMPRESSION: 1. No acute intrathoracic process. Electronically Signed   By: Randa Ngo M.D.   On: 09/17/2020 17:48    Cardiac Studies   MRI pending.    Patient Profile     50 y.o. male with nonobstructive CAD, hyperlipidemia, LBBB who is being seen 09/17/2020 for the evaluation of syncope.  Assessment & Plan    SYNCOPE:  Secondary to CHB.  He will need device therapy and I sent a message to EP.   SEPTAL HYPERTROPHY:  Needs cardiac MRI prior to device.  That will be done this morning.    For questions or updates, please contact [place] Please consult [email or web address] for contact info under Cardiology/STEMI.   Signed, Minus Breeding, MD  09/18/2020, 7:39 AM

## 2020-09-18 NOTE — Discharge Instructions (Signed)
    Supplemental Discharge Instructions for  Pacemaker/Defibrillator Patients  Activity No heavy lifting or vigorous activity with your left/right arm for 6 to 8 weeks.  Do not raise your left/right arm above your head for one week.  Gradually raise your affected arm as drawn below.              09/23/20                     09/24/20                     09/25/20                    09/26/20 __  NO DRIVING until cleared to at your wound check visit  WOUND CARE Keep the wound area clean and dry.  Do not get this area wet , no showers until cleared to at your wound check visit The tape/steri-strips on your wound will fall off; do not pull them off.  No bandage is needed on the site.  DO  NOT apply any creams, oils, or ointments to the wound area. If you notice any drainage or discharge from the wound, any swelling or bruising at the site, or you develop a fever > 101? F after you are discharged home, call the office at once.  Special Instructions You are still able to use cellular telephones; use the ear opposite the side where you have your pacemaker/defibrillator.  Avoid carrying your cellular phone near your device. When traveling through airports, show security personnel your identification card to avoid being screened in the metal detectors.  Ask the security personnel to use the hand wand. Avoid arc welding equipment, MRI testing (magnetic resonance imaging), TENS units (transcutaneous nerve stimulators).  Call the office for questions about other devices. Avoid electrical appliances that are in poor condition or are not properly grounded. Microwave ovens are safe to be near or to operate.   

## 2020-09-18 NOTE — Interval H&P Note (Signed)
History and Physical Interval Note:  09/18/2020 2:15 PM  Joel Lynch  has presented today for surgery, with the diagnosis of heart block.  The various methods of treatment have been discussed with the patient and family. After consideration of risks, benefits and other options for treatment, the patient has consented to  Procedure(s): PACEMAKER IMPLANT (N/A) as a surgical intervention.  The patient's history has been reviewed, patient examined, no change in status, stable for surgery.  I have reviewed the patient's chart and labs.  Questions were answered to the patient's satisfaction.     Thompson Grayer

## 2020-09-19 ENCOUNTER — Inpatient Hospital Stay (HOSPITAL_COMMUNITY): Payer: BC Managed Care – PPO

## 2020-09-19 ENCOUNTER — Encounter (HOSPITAL_COMMUNITY): Payer: Self-pay | Admitting: Internal Medicine

## 2020-09-19 LAB — BASIC METABOLIC PANEL
Anion gap: 9 (ref 5–15)
BUN: 12 mg/dL (ref 6–20)
CO2: 26 mmol/L (ref 22–32)
Calcium: 9 mg/dL (ref 8.9–10.3)
Chloride: 102 mmol/L (ref 98–111)
Creatinine, Ser: 1.08 mg/dL (ref 0.61–1.24)
GFR, Estimated: >60 mL/min (ref >60)
Glucose, Bld: 92 mg/dL (ref 70–99)
Potassium: 3.9 mmol/L (ref 3.5–5.1)
Sodium: 137 mmol/L (ref 135–145)

## 2020-09-19 LAB — GLUCOSE, CAPILLARY: Glucose-Capillary: 101 mg/dL — ABNORMAL HIGH (ref 70–99)

## 2020-09-19 NOTE — Discharge Summary (Addendum)
ELECTROPHYSIOLOGY PROCEDURE DISCHARGE SUMMARY    Patient ID: Joel Lynch,  MRN: [id number], DOB/AGE: [birth year] 50 y.o.  Admit date: 09/17/2020 Discharge date: 09/19/2020  Primary Care Physician: Martinique, [name], MD  Primary Cardiologist: Dr. Geraldo Pitter Electrophysiologist: new, Dr. Rayann Heman  Primary Discharge Diagnosis:  Syncope CHB  Secondary Discharge Diagnosis:  HLD LBBB  No Known Allergies   Procedures This Admission:  1.  Implantation of a MDT dual chamber PPM on 09/18/20 by Dr Rayann Heman.  The patient received  Medtronic Azure XT DR MRI SureScan model [id number],  Medtronic model [id number] (serial number [id number]) right atrial lead and a Medtronic model 3830-69(serial number [id number]) right ventricular lead There were no immediate post procedure complications. 2.  CXR on 09/19/20 demonstrated no pneumothorax status post device implantation.   Brief HPI: Joel Lynch is a 50 y.o. male with a hx of CAD (non-obstructive by CT), HLD, obesity, LBBB and syncope admitted with recurrent syncope associated with CHB/V pauses.  Hospital Course:  The patient  last saw Dr. Geraldo Pitter 09/05/20, there was discussion of some dizzy spells no syncope (though does have history of syncope going back to 2019) or palpitations and a hx of 4beat NSVT remotely and with LBBB planned for monitoring. There are phone notes that report to the monitor noted some NSVT again and he was started on Toprol 12.'5mg'$  BID. The day of his admission he had recurrent syncope associated with episode of CHB with V pauses 4-4.8 seconds.  This event described as the same as his prior syncope that pre dated his toprol start He was admitted and EP consulted for consideration of PPM.  His prior coronary CT had suggested septal hypertrophy, c.MRI completed with no significant LVH or diagnosis of HCM, no LGE noted, LVEF 51%. Given baseline conduction system disease and recurrent syncope even prior to his start of Toprol, was decided  to proceed with PPM Available monitor tracings were reviewed and noted the transient CHB another strip with what looked like an SVT, and a 4 beat WCT within it, these not associated with symptoms it seems  He underwent implantation of a PPM with details as outlined above.  He was monitored on telemetry overnight which demonstrated SR.  Left chest was without hematoma or ecchymosis.  The device was interrogated and found to be functioning normally.  CXR was obtained and demonstrated no pneumothorax status post device implantation.  Wound care, arm mobility, and restrictions were reviewed with the patient.  The patient feels well, denies any CP/SOB, with minimal site discomfort.  He was examined by Dr. Rayann Heman and considered stable for discharge to home.   Will stop aspirin until his follow up with Dr.Revankar Will stop metoprolol and follow rhythm via his device  The patient has some kind of MRI-like machine at his work place that he does not work on/with but historically has been by or near it on [address].  Mentions there are signs posted  not to pass if have a pacemaker.  I have advised him to stay out of that area and not pass the posted signs.   also reach out to his facilities team to confirm safety with pacemaker and where he is safe to be at in regards to this.    I have also asked medtronic industry rep to revisit with the patient to make sure he has technical/patient support if needed.       Physical Exam: Vitals:   09/18/20 2305 09/19/20 0035 09/19/20 0500 09/19/20  0806  BP: 102/71 97/67 108/86 122/74  Pulse: 87 63 77 84  Resp: '19 15 16 14  '$ Temp: 98.2 F (36.8 C)  98.4 F (36.9 C) 97.7 F (36.5 C)  TempSrc: Oral  Oral Oral  SpO2: 96% 95% 94% 97%  Weight:      Height:        GEN- The patient is well appearing, alert and oriented x 3 today.   HEENT: normocephalic, atraumatic; sclera clear, conjunctiva pink; hearing intact; oropharynx clear; neck supple, no JVP Lungs- CTA b/l,  normal work of breathing.  No wheezes, rales, rhonchi Heart- RRR, no murmurs, rubs or gallops, PMI not laterally displaced GI- soft, non-tender, non-distended Extremities- no clubbing, cyanosis, or edema MS- no significant deformity or atrophy Skin- warm and dry, no rash or lesion, left chest without hematoma/ecchymosis Psych- euthymic mood, full affect Neuro- no gross deficits   Labs:   Lab Results  Component Value Date   WBC 5.2 09/18/2020   HGB 15.2 09/18/2020   HCT 44.4 09/18/2020   MCV 98.2 09/18/2020   PLT 166 09/18/2020    Recent Labs  Lab 09/17/20 1725 09/18/20 0500 09/19/20 0139  NA 138   < > 137  K 4.0   < > 3.9  CL 105   < > 102  CO2 26   < > 26  BUN 9   < > 12  CREATININE 1.02   < > 1.08  CALCIUM 9.4   < > 9.0  PROT 7.1  --   --   BILITOT 1.1  --   --   ALKPHOS 49  --   --   ALT 31  --   --   AST 28  --   --   GLUCOSE 81   < > 92   < > = values in this interval not displayed.    Discharge Medications:  Allergies as of 09/19/2020   No Known Allergies      Medication List     STOP taking these medications    aspirin EC 81 MG tablet   metoprolol succinate 25 MG 24 hr tablet Commonly known as: Toprol XL       TAKE these medications    diphenhydrAMINE 25 MG tablet Commonly known as: BENADRYL Take 25 mg by mouth at bedtime as needed for allergies or sleep.   ibuprofen 800 MG tablet Commonly known as: ADVIL Take 800 mg by mouth every 8 (eight) hours as needed for headache.   loratadine 10 MG tablet Commonly known as: CLARITIN Take 10 mg by mouth daily as needed for allergies.   rosuvastatin 10 MG tablet Commonly known as: CRESTOR Take 1 tablet (10 mg total) by mouth daily.        Disposition: Home Discharge Instructions     Diet - low sodium heart healthy   Complete by: As directed    Increase activity slowly   Complete by: As directed        Follow-up Information     Shirley Friar, PA-C Follow up.    Specialty: Physician Assistant Why: 09/28/20 @ 8:20AM, wound check visit Contact information: [place] [address] [phone]         Thompson Grayer, MD Follow up.   Specialty: Cardiology Why: 12/24/20 @ 2:15PM Contact information: [place] [address] [phone]         Revankar, Reita Cliche, MD Follow up.   Specialty:  Cardiology Why: 10/24/20 @ 2:00PM, post hospital visit Contact information: [place] [address] [phone]                 Duration of Discharge Encounter: Greater than 30 minutes including physician time.  Signed, Tommye Standard, PA-C 09/19/2020 10:07 AM  I have seen, examined the patient, and reviewed the above assessment and plan.  Changes to above are made where necessary.  On exam, RRR.  CXR reveals stable leads, no ptx.  Device interrogation is personally reviewed and normal  DC to home.  Co Sign: Thompson Grayer, MD

## 2020-09-24 MED FILL — Lidocaine HCl Local Inj 1%: INTRAMUSCULAR | Qty: 45 | Status: AC

## 2020-09-28 ENCOUNTER — Other Ambulatory Visit: Payer: Self-pay

## 2020-09-28 ENCOUNTER — Ambulatory Visit: Payer: BC Managed Care – PPO | Admitting: Student

## 2020-09-28 DIAGNOSIS — I447 Left bundle-branch block, unspecified: Secondary | ICD-10-CM

## 2020-09-28 LAB — CUP PACEART INCLINIC DEVICE CHECK
Battery Remaining Longevity: 140 mo
Battery Voltage: 3.2 V
Brady Statistic AP VP Percent: 1.62 %
Brady Statistic AP VS Percent: 0.01 %
Brady Statistic AS VP Percent: 97.29 %
Brady Statistic AS VS Percent: 1.08 %
Brady Statistic RA Percent Paced: 1.63 %
Brady Statistic RV Percent Paced: 98.91 %
Date Time Interrogation Session: 20220812082809
Implantable Lead Implant Date: 20220802
Implantable Lead Implant Date: 20220802
Implantable Lead Location: 753859
Implantable Lead Location: 753860
Implantable Lead Model: 3830
Implantable Lead Model: 5076
Implantable Pulse Generator Implant Date: 20220802
Lead Channel Impedance Value: 399 Ohm
Lead Channel Impedance Value: 418 Ohm
Lead Channel Impedance Value: 475 Ohm
Lead Channel Impedance Value: 551 Ohm
Lead Channel Pacing Threshold Amplitude: 0.5 V
Lead Channel Pacing Threshold Amplitude: 0.625 V
Lead Channel Pacing Threshold Pulse Width: 0.4 ms
Lead Channel Pacing Threshold Pulse Width: 0.4 ms
Lead Channel Sensing Intrinsic Amplitude: 1.5 mV
Lead Channel Sensing Intrinsic Amplitude: 2.125 mV
Lead Channel Sensing Intrinsic Amplitude: 22.75 mV
Lead Channel Sensing Intrinsic Amplitude: 25.375 mV
Lead Channel Setting Pacing Amplitude: 3.5 V
Lead Channel Setting Pacing Amplitude: 3.5 V
Lead Channel Setting Pacing Pulse Width: 0.4 ms
Lead Channel Setting Sensing Sensitivity: 2.8 mV

## 2020-09-28 NOTE — Progress Notes (Signed)
Wound check appointment. Steri-strips removed. Wound without redness or edema. Incision edges approximated, wound well healed. Normal device function. Thresholds, sensing, and impedances consistent with implant measurements. Device programmed at 3.5V/auto capture programmed on for extra safety margin until 3 month visit. Histogram distribution appropriate for patient and level of activity. No mode switches or high ventricular rates noted. Patient educated about wound care, arm mobility, lifting restrictions. ROV in 3 months with Dr. Rayann Heman

## 2020-10-23 ENCOUNTER — Institutional Professional Consult (permissible substitution): Payer: BC Managed Care – PPO | Admitting: Cardiology

## 2020-10-24 ENCOUNTER — Encounter: Payer: Self-pay | Admitting: Cardiology

## 2020-10-24 ENCOUNTER — Ambulatory Visit: Payer: BC Managed Care – PPO | Admitting: Cardiology

## 2020-10-24 ENCOUNTER — Other Ambulatory Visit: Payer: Self-pay

## 2020-10-24 ENCOUNTER — Telehealth: Payer: Self-pay

## 2020-10-24 VITALS — BP 112/76 | HR 84 | Ht 69.0 in | Wt 203.4 lb

## 2020-10-24 DIAGNOSIS — I2510 Atherosclerotic heart disease of native coronary artery without angina pectoris: Secondary | ICD-10-CM

## 2020-10-24 DIAGNOSIS — E782 Mixed hyperlipidemia: Secondary | ICD-10-CM

## 2020-10-24 DIAGNOSIS — I447 Left bundle-branch block, unspecified: Secondary | ICD-10-CM | POA: Diagnosis not present

## 2020-10-24 DIAGNOSIS — I4729 Other ventricular tachycardia: Secondary | ICD-10-CM

## 2020-10-24 DIAGNOSIS — I472 Ventricular tachycardia: Secondary | ICD-10-CM

## 2020-10-24 DIAGNOSIS — R55 Syncope and collapse: Secondary | ICD-10-CM

## 2020-10-24 DIAGNOSIS — Z950 Presence of cardiac pacemaker: Secondary | ICD-10-CM

## 2020-10-24 NOTE — Telephone Encounter (Signed)
Attempted to reach patient to assist with sending manual transmission.  Patient should have [facility] app on his personal cellphone per carelink website.  No answer, VM is not set up.

## 2020-10-24 NOTE — Progress Notes (Signed)
Cardiology Office Note:    Date:  10/24/2020   ID:  Joel Lynch, DOB 1970/12/20, MRN [id number]  PCP:  Martinique, [name], MD  Cardiologist:  Jenean Lindau, MD   Referring MD: Martinique, [name], MD    ASSESSMENT:    1. Coronary artery disease involving native coronary artery of native heart without angina pectoris   2. Left bundle branch block (LBBB)   3. Nonsustained ventricular tachycardia ([place])   4. Mixed dyslipidemia   5. Syncope and collapse   6. Presence of permanent cardiac pacemaker    PLAN:    In order of problems listed above:  Coronary artery disease: Nonobstructive in nature: Secondary prevention stressed with the patient.  Importance of compliance with diet medication stressed any vocalized understanding. Mixed dyslipidemia: Diet was emphasized.  Lipids were reviewed.  They checked by his primary care physician and he will get rechecked in the next few months.  Recent lipid work was reviewed with him. High-grade AV block: Permanent pacemaker: Followed by electrophysiology colleagues.  I discussed the event monitor findings with the patient at length and questions were answered to his satisfaction. Patient will be seen in follow-up appointment in 6 months or earlier if the patient has any concerns    Medication Adjustments/Labs and Tests Ordered: Current medicines are reviewed at length with the patient today.  Concerns regarding medicines are outlined above.  No orders of the defined types were placed in this encounter.  No orders of the defined types were placed in this encounter.    No chief complaint on file.    History of Present Illness:    Joel Lynch is a 50 y.o. male.  Patient has past medical history of coronary artery disease nonobstructive in nature, dyslipidemia and high-grade AV block for which patient underwent permanent pacemaker insertion.  Subsequently standpoint.  No chest pain orthopnea or PND.  At the time of my evaluation, the patient  is alert awake oriented and in no distress.  Past Medical History:  Diagnosis Date   Allergy    ARTHRALGIA 03/07/2010   Qualifier: Diagnosis of  By: Elizebeth Koller MD, [place] Appl    Arthritis    Chest tightness 06/27/2020   Chicken pox    Coronary artery disease 09/05/2020   DOE (dyspnea on exertion) 06/27/2020   Headache    Left bundle branch block    Left bundle branch block (LBBB) 12/29/2016   Mixed dyslipidemia 10/28/2019   Non-traumatic mid back pain 04/24/2016   Nonsustained ventricular tachycardia ([place]) 09/05/2020   Syncope 11/28/2016   Syncope and collapse 09/17/2020    Past Surgical History:  Procedure Laterality Date   PACEMAKER IMPLANT N/A 09/18/2020   Procedure: PACEMAKER IMPLANT;  Surgeon: Thompson Grayer, MD;  Location: [facility];  Service: Cardiovascular;  Laterality: N/A;   WRIST SURGERY  2003   broken hand/wrist    Current Medications: Current Meds  Medication Sig   Acetaminophen (TYLENOL PO) Take 1 tablet by mouth as needed (pain).   diphenhydrAMINE (BENADRYL) 25 MG tablet Take 25 mg by mouth at bedtime as needed for allergies or sleep.   ibuprofen (ADVIL,MOTRIN) 800 MG tablet Take 800 mg by mouth every 8 (eight) hours as needed for headache.   loratadine (CLARITIN) 10 MG tablet Take 10 mg by mouth daily as needed for allergies.   rosuvastatin (CRESTOR) 10 MG tablet Take 1 tablet (10 mg total) by mouth daily.     Allergies:   Patient has no known  allergies.   Social History   Socioeconomic History   Marital status: Married    Spouse name: Not on file   Number of children: 1   Years of education: PhD   Highest education level: Not on file  Occupational History   Occupation: professor of Doctor, hospital: [employer]  Tobacco Use   Smoking status: Never   Smokeless tobacco: Never  Vaping Use   Vaping Use: Never used  Substance and Sexual Activity   Alcohol use: Yes    Comment: 3 - 4 times per week    Drug use: No   Sexual activity: Not on  file  Other Topics Concern   Not on file  Social History Narrative   Lives with wife and child in a one story home.  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  Education: Phd   Social Determinants of Radio broadcast assistant Strain: Not on file  Food Insecurity: Not on file  Transportation Needs: Not on file  Physical Activity: Not on file  Stress: Not on file  Social Connections: Not on file     Family History: The patient's family history includes Arthritis in his father and paternal grandmother; Cancer (age of onset: 62) in his father; Diabetes in his mother; Heart disease in his paternal grandmother; Hyperlipidemia in his mother; Hypertension in his paternal grandmother; Stroke in his maternal grandmother.  ROS:   Please see the history of present illness.    All other systems reviewed and are negative.  EKGs/Labs/Other Studies Reviewed:    The following studies were reviewed today: I discussed my findings with the patient extensively.   Recent Labs: 09/17/2020: ALT 31; B Natriuretic Peptide 37.1; Magnesium 2.2 09/18/2020: Hemoglobin 15.2; Platelets 166; TSH 3.382 09/19/2020: BUN 12; Creatinine, Ser 1.08; Potassium 3.9; Sodium 137  Recent Lipid Panel    Component Value Date/Time   CHOL 172 08/31/2020 0806   TRIG 95 08/31/2020 0806   HDL 45 08/31/2020 0806   CHOLHDL 3.8 08/31/2020 0806   CHOLHDL 4 05/10/2012 1454   VLDL 17.2 05/10/2012 1454   LDLCALC 109 (H) 08/31/2020 0806    Physical Exam:    VS:  BP 112/76   Pulse 84   Ht '5\' 9"'$  (1.753 m)   Wt 203 lb 6.4 oz (92.3 kg)   SpO2 96%   BMI 30.04 kg/m     Wt Readings from Last 3 Encounters:  10/24/20 203 lb 6.4 oz (92.3 kg)  09/17/20 200 lb (90.7 kg)  09/05/20 204 lb 3.2 oz (92.6 kg)     GEN: Patient is in no acute distress HEENT: Normal NECK: No JVD; No carotid bruits LYMPHATICS: No lymphadenopathy CARDIAC: Hear sounds regular, 2/6 systolic murmur at the apex. RESPIRATORY:  Clear to auscultation without  rales, wheezing or rhonchi  ABDOMEN: Soft, non-tender, non-distended MUSCULOSKELETAL:  No edema; No deformity  SKIN: Warm and dry NEUROLOGIC:  Alert and oriented x 3 PSYCHIATRIC:  Normal affect   Signed, Jenean Lindau, MD  10/24/2020 2:48 PM    [facility]

## 2020-10-24 NOTE — Patient Instructions (Signed)
Medication Instructions:  No medication changes *If you need a refill on your cardiac medications before your next appointment, please call your pharmacy*   Lab Work: Your physician recommends that you have a BMET today in the office.  If you have labs (blood work) drawn today and your tests are completely normal, you will receive your results only by: . MyChart Message (if you have MyChart) OR . A paper copy in the mail If you have any lab test that is abnormal or we need to change your treatment, we will call you to review the results.   Testing/Procedures: None ordered   Follow-Up: At [facility], you and your health needs are our priority.  As part of our continuing mission to provide you with exceptional heart care, we have created designated Provider Care Teams.  These Care Teams include your primary Cardiologist (physician) and Advanced Practice Providers (APPs -  Physician Assistants and Nurse Practitioners) who all work together to provide you with the care you need, when you need it.  We recommend signing up for the patient portal called "MyChart".  Sign up information is provided on this After Visit Summary.  MyChart is used to connect with patients for Virtual Visits (Telemedicine).  Patients are able to view lab/test results, encounter notes, upcoming appointments, etc.  Non-urgent messages can be sent to your provider as well.   To learn more about what you can do with MyChart, go to [email or web address].    Your next appointment:   6 month(s)  The format for your next appointment:   In Person  Provider:   Rajan Revankar, MD   Other Instructions NA  

## 2020-10-24 NOTE — Telephone Encounter (Signed)
-----   Message from Truddie Hidden, RN sent at 10/24/2020  3:05 PM EDT ----- Regarding: pacer Pt states he has been feeling fatigued and would like to make sure his pacemaker is ok. Joel Lynch

## 2020-10-25 LAB — BASIC METABOLIC PANEL
BUN/Creatinine Ratio: 10 (ref 9–20)
BUN: 10 mg/dL (ref 6–24)
CO2: 23 mmol/L (ref 20–29)
Calcium: 9.5 mg/dL (ref 8.7–10.2)
Chloride: 102 mmol/L (ref 96–106)
Creatinine, Ser: 0.99 mg/dL (ref 0.76–1.27)
Glucose: 81 mg/dL (ref 65–99)
Potassium: 4.3 mmol/L (ref 3.5–5.2)
Sodium: 139 mmol/L (ref 134–144)
eGFR: 93 mL/min/{1.73_m2} (ref >59)

## 2020-10-25 NOTE — Telephone Encounter (Signed)
I spoke with the patient and he agreed to send a manual transmission with his monitor when he get home from work.

## 2020-10-25 NOTE — Telephone Encounter (Signed)
I called patient to give him blood work results from Dr. Geraldo Pitter and he told me no one had contacted him about the pacemaker.  I looked in the chart and explained that someone had called him at 5:08 pm on 10-24-20 and there was no answer and no voicemail was set up.  Patient informed me he was running errands and would like a call back today about this.

## 2020-10-29 NOTE — Telephone Encounter (Signed)
Manual transmission received, normal device function.  Patient is pacing 99.59% in RV.  No arrythmias logged.     Advised aptient pacemaker is programmed with LRL of 35.  He should continue to notice improvement in energy evel as his heart gets used to PPM.

## 2020-11-01 ENCOUNTER — Ambulatory Visit: Payer: BC Managed Care – PPO | Admitting: Cardiology

## 2020-11-05 ENCOUNTER — Other Ambulatory Visit: Payer: Self-pay

## 2020-11-06 ENCOUNTER — Ambulatory Visit: Payer: BC Managed Care – PPO | Admitting: Family Medicine

## 2020-11-06 ENCOUNTER — Encounter: Payer: Self-pay | Admitting: Family Medicine

## 2020-11-06 VITALS — BP 110/70 | HR 86 | Temp 98.3°F | Resp 16 | Ht 69.0 in | Wt 203.0 lb

## 2020-11-06 DIAGNOSIS — R5383 Other fatigue: Secondary | ICD-10-CM | POA: Diagnosis not present

## 2020-11-06 DIAGNOSIS — M2550 Pain in unspecified joint: Secondary | ICD-10-CM | POA: Diagnosis not present

## 2020-11-06 DIAGNOSIS — Z113 Encounter for screening for infections with a predominantly sexual mode of transmission: Secondary | ICD-10-CM

## 2020-11-06 DIAGNOSIS — I442 Atrioventricular block, complete: Secondary | ICD-10-CM

## 2020-11-06 DIAGNOSIS — R0600 Dyspnea, unspecified: Secondary | ICD-10-CM

## 2020-11-06 DIAGNOSIS — R0789 Other chest pain: Secondary | ICD-10-CM

## 2020-11-06 DIAGNOSIS — Z23 Encounter for immunization: Secondary | ICD-10-CM

## 2020-11-06 LAB — C-REACTIVE PROTEIN: CRP: <1 mg/dL (ref 0.5–20.0)

## 2020-11-06 LAB — SEDIMENTATION RATE: Sed Rate: 19 mm/hr — ABNORMAL HIGH (ref 0–15)

## 2020-11-06 NOTE — Patient Instructions (Signed)
A few things to remember from today's visit:  Fatigue, unspecified type  Complete heart block ([place]) - Plan: Ambulatory referral to Cardiology, RPR, B. burgdorfi Antibody, Protein Electrophoresis, Urine Rflx.  Polyarthralgia - Plan: ANA, C-reactive protein, Sedimentation rate, Rheumatoid factor, Cyclic citrul peptide antibody, IgG, B. burgdorfi Antibody  If you need refills please call your pharmacy. Do not use My Chart to request refills or for acute issues that need immediate attention.   No changes today.  Please be sure medication list is accurate. If a new problem present, please set up appointment sooner than planned today.

## 2020-11-06 NOTE — Progress Notes (Signed)
HPI:  Joel Lynch is a 50 y.o. male, who is here today for follow up.  He was last seen in 11/2016 after an episode of syncope.  He was admitted to the hospital on 09/17/2020 because of presyncopal episode. The day of his admission he had recurrent syncope associated with episode of complete heart block  Left bundle branch block and CAD. He has been following with cardiology, s/p pacemaker implant on 09/18/20.  In general he is feeling better after pacemaker placement, dizzy spells have greatly improved. He has some concerns about Dx and questions about pacemaker lifespan and his life expectancy.  He is also inquiring about possible heart block etiology, which seems to be idiopathic at this time. He would like to see another cardiologist for a second opinion.  He is not having CP,palpitations,SOB,or diaphoresis. Negative for orthopnea and PND.  He reports that his heart is being paced 99% of the time. Prior to symptoms onset in 2018 he did not travel overseas, no insect bite,illness. Sister was thought to have lupus but ruled out.  Lab Results  Component Value Date   CREATININE 0.99 10/24/2020   BUN 10 10/24/2020   NA 139 10/24/2020   K 4.3 10/24/2020   CL 102 10/24/2020   CO2 23 10/24/2020   FHx negative for congenital cardiac disease.  Fatigue: Usually at the end of the day, gradually improving. Sleeps about 7 hours. He feels rested in the morning. Has had to leave work earlier because extreme fatigue.  Lab Results  Component Value Date   WBC 5.2 09/18/2020   HGB 15.2 09/18/2020   HCT 44.4 09/18/2020   MCV 98.2 09/18/2020   PLT 166 09/18/2020   + Arthralgias for about a year, shoulders and hips mainly. Negative for skin rash or oral lesions. No edema or erythema. Pain is exacerbated by movement. No hx of trauma.  Lab Results  Component Value Date   CHOL 172 08/31/2020   HDL 45 08/31/2020   LDLCALC 109 (H) 08/31/2020   TRIG 95 08/31/2020   CHOLHDL  3.8 08/31/2020   Review of Systems  Constitutional:  Positive for activity change. Negative for appetite change and fever.  HENT:  Negative for nosebleeds and sore throat.   Eyes:  Negative for redness and visual disturbance.  Respiratory:  Negative for cough and wheezing.   Cardiovascular:  Negative for leg swelling.  Gastrointestinal:  Negative for abdominal pain, nausea and vomiting.  Genitourinary:  Negative for decreased urine volume, dysuria and hematuria.  Musculoskeletal:  Negative for gait problem.  Neurological:  Negative for dizziness, seizures, weakness, numbness and headaches.  Psychiatric/Behavioral:  Negative for confusion and sleep disturbance. The patient is nervous/anxious.   Rest of ROS, see pertinent positives sand negatives in HPI  Current Outpatient Medications on File Prior to Visit  Medication Sig Dispense Refill   Acetaminophen (TYLENOL PO) Take 1 tablet by mouth as needed (pain).     diphenhydrAMINE (BENADRYL) 25 MG tablet Take 25 mg by mouth at bedtime as needed for allergies or sleep.     ibuprofen (ADVIL,MOTRIN) 800 MG tablet Take 800 mg by mouth every 8 (eight) hours as needed for headache.     loratadine (CLARITIN) 10 MG tablet Take 10 mg by mouth daily as needed for allergies.     rosuvastatin (CRESTOR) 10 MG tablet Take 1 tablet (10 mg total) by mouth daily. 90 tablet 3   No current facility-administered medications on file prior to visit.   Past  Medical History:  Diagnosis Date   Allergy    ARTHRALGIA 03/07/2010   Qualifier: Diagnosis of  By: Elizebeth Koller MD, Mora Appl    Arthritis    Chest tightness 06/27/2020   Chicken pox    Coronary artery disease 09/05/2020   DOE (dyspnea on exertion) 06/27/2020   Headache    Left bundle branch block    Left bundle branch block (LBBB) 12/29/2016   Mixed dyslipidemia 10/28/2019   Non-traumatic mid back pain 04/24/2016   Nonsustained ventricular tachycardia ([place]) 09/05/2020   Syncope 11/28/2016   Syncope and  collapse 09/17/2020   No Known Allergies  Social History   Socioeconomic History   Marital status: Married    Spouse name: Not on file   Number of children: 1   Years of education: PhD   Highest education level: Not on file  Occupational History   Occupation: professor of Doctor, hospital: [employer]   Tobacco Use   Smoking status: Never   Smokeless tobacco: Never  Vaping Use   Vaping Use: Never used  Substance and Sexual Activity   Alcohol use: Yes    Comment: 3 - 4 times per week    Drug use: No   Sexual activity: Not on file  Other Topics Concern   Not on file  Social History Narrative   Lives with wife and child in a one story home.  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  Education: Phd   Social Determinants of Radio broadcast assistant Strain: Not on file  Food Insecurity: Not on file  Transportation Needs: Not on file  Physical Activity: Not on file  Stress: Not on file  Social Connections: Not on file   Vitals:   11/06/20 1352  BP: 110/70  Pulse: 86  Resp: 16  Temp: 98.3 F (36.8 C)  SpO2: 97%   Body mass index is 29.98 kg/m.  Physical Exam Nursing note reviewed.  Constitutional:      General: He is not in acute distress.    Appearance: He is well-developed.  HENT:     Head: Normocephalic and atraumatic.  Eyes:     Conjunctiva/sclera: Conjunctivae normal.  Cardiovascular:     Rate and Rhythm: Normal rate and regular rhythm.     Pulses:          Dorsalis pedis pulses are 2+ on the right side and 2+ on the left side.     Heart sounds: No murmur heard. Pulmonary:     Effort: Pulmonary effort is normal. No respiratory distress.     Breath sounds: Normal breath sounds.  Abdominal:     Palpations: Abdomen is soft. There is no hepatomegaly or mass.     Tenderness: There is no abdominal tenderness.  Lymphadenopathy:     Cervical: No cervical adenopathy.  Skin:    General: Skin is warm.     Findings: No erythema or rash.   Neurological:     Mental Status: He is alert and oriented to person, place, and time.     Cranial Nerves: No cranial nerve deficit.     Gait: Gait normal.  Psychiatric:     Comments: Well groomed, good eye contact.   ASSESSMENT AND PLAN:  Joel Lynch was seen today for follow-up.  Orders Placed This Encounter  Procedures   Flu Vaccine QUAD 51mo+IM (Fluarix, Fluzone & Alfiuria Quad PF)   ANA   C-reactive protein   Sedimentation rate   RPR  Rheumatoid factor   Cyclic citrul peptide antibody, IgG   B. burgdorfi Antibody   Protein Electrophoresis, Urine Rflx.   Ambulatory referral to Cardiology   Lab Results  Component Value Date   ESRSEDRATE 19 (H) 11/06/2020   Lab Results  Component Value Date   CRP <1.0 11/06/2020   Fatigue, unspecified type We discussed possible causes, recent hospitalization and health issues are most likely contributing factors. Gradually improving. Further recommendations according to lab results.  Complete heart block (HCC) S/P pacemaker placement. I answered all his questions about possible etiologies and some related to pacemaker to the best of my knowledge. He would like to see another cardiologist for a second opinion.  Polyarthralgia We discussed possible etiologies. ? OA. Explained that ANA can be positive in certain percentage of the population without lupus disease. Further recommendations according to lab results.  Need for influenza vaccination -     Flu Vaccine QUAD 39mo+IM (Fluarix, Fluzone & Alfiuria Quad PF)  Return in about 3 months (around 02/05/2021) for cpe.   Tove Wideman G. Martinique, MD  [facility]. [place] office.

## 2020-11-09 LAB — B. BURGDORFI ANTIBODIES: B burgdorferi Ab IgG+IgM: <0.9 index

## 2020-11-09 LAB — RPR: RPR Ser Ql: NONREACTIVE

## 2020-11-09 LAB — CYCLIC CITRUL PEPTIDE ANTIBODY, IGG: Cyclic Citrullin Peptide Ab: <16 UNITS

## 2020-11-09 LAB — ANA: Anti Nuclear Antibody (ANA): NEGATIVE

## 2020-11-09 LAB — RHEUMATOID FACTOR: Rheumatoid fact SerPl-aCnc: <14 IU/mL (ref <14)

## 2020-11-12 ENCOUNTER — Telehealth: Payer: Self-pay | Admitting: Cardiology

## 2020-11-12 NOTE — Telephone Encounter (Signed)
Patient is wanting to switch from Dr. Geraldo Pitter to Dr. Ali Lowe.

## 2020-11-13 LAB — PROTEIN ELECTROPHORESIS, URINE REFLEX
Albumin ELP, Urine: 21.6 %
Alpha-1-Globulin, U: 8.6 %
Alpha-2-Globulin, U: 14.5 %
Beta Globulin, U: 32 %
Gamma Globulin, U: 23.3 %
Protein, Ur: 7.1 mg/dL

## 2020-12-19 ENCOUNTER — Ambulatory Visit (INDEPENDENT_AMBULATORY_CARE_PROVIDER_SITE_OTHER): Payer: BC Managed Care – PPO

## 2020-12-19 DIAGNOSIS — R55 Syncope and collapse: Secondary | ICD-10-CM

## 2020-12-19 LAB — CUP PACEART REMOTE DEVICE CHECK
Battery Remaining Longevity: 153 mo
Battery Voltage: 3.17 V
Brady Statistic AP VP Percent: 4.48 %
Brady Statistic AP VS Percent: 0.01 %
Brady Statistic AS VP Percent: 95.17 %
Brady Statistic AS VS Percent: 0.35 %
Brady Statistic RA Percent Paced: 4.49 %
Brady Statistic RV Percent Paced: 99.65 %
Date Time Interrogation Session: 20221102064841
Implantable Lead Implant Date: 20220802
Implantable Lead Implant Date: 20220802
Implantable Lead Location: 753859
Implantable Lead Location: 753860
Implantable Lead Model: 3830
Implantable Lead Model: 5076
Implantable Pulse Generator Implant Date: 20220802
Lead Channel Impedance Value: 399 Ohm
Lead Channel Impedance Value: 437 Ohm
Lead Channel Impedance Value: 513 Ohm
Lead Channel Impedance Value: 589 Ohm
Lead Channel Pacing Threshold Amplitude: 0.375 V
Lead Channel Pacing Threshold Amplitude: 0.625 V
Lead Channel Pacing Threshold Pulse Width: 0.4 ms
Lead Channel Pacing Threshold Pulse Width: 0.4 ms
Lead Channel Sensing Intrinsic Amplitude: 2.625 mV
Lead Channel Sensing Intrinsic Amplitude: 2.625 mV
Lead Channel Sensing Intrinsic Amplitude: 31.5 mV
Lead Channel Sensing Intrinsic Amplitude: 31.5 mV
Lead Channel Setting Pacing Amplitude: 1.5 V
Lead Channel Setting Pacing Amplitude: 2 V
Lead Channel Setting Pacing Pulse Width: 0.4 ms
Lead Channel Setting Sensing Sensitivity: 2.8 mV

## 2020-12-24 ENCOUNTER — Other Ambulatory Visit: Payer: Self-pay

## 2020-12-24 ENCOUNTER — Encounter: Payer: Self-pay | Admitting: Internal Medicine

## 2020-12-24 ENCOUNTER — Ambulatory Visit: Payer: BC Managed Care – PPO | Admitting: Internal Medicine

## 2020-12-24 VITALS — BP 110/72 | HR 93 | Ht 69.0 in | Wt 205.6 lb

## 2020-12-24 DIAGNOSIS — I4729 Other ventricular tachycardia: Secondary | ICD-10-CM

## 2020-12-24 DIAGNOSIS — R55 Syncope and collapse: Secondary | ICD-10-CM

## 2020-12-24 DIAGNOSIS — I442 Atrioventricular block, complete: Secondary | ICD-10-CM

## 2020-12-24 LAB — CUP PACEART INCLINIC DEVICE CHECK
Battery Remaining Longevity: 153 mo
Battery Voltage: 3.17 V
Brady Statistic AP VP Percent: 4.05 %
Brady Statistic AP VS Percent: 0.01 %
Brady Statistic AS VP Percent: 95.58 %
Brady Statistic AS VS Percent: 0.36 %
Brady Statistic RA Percent Paced: 4.06 %
Brady Statistic RV Percent Paced: 99.63 %
Date Time Interrogation Session: 20221107144159
Implantable Lead Implant Date: 20220802
Implantable Lead Implant Date: 20220802
Implantable Lead Location: 753859
Implantable Lead Location: 753860
Implantable Lead Model: 3830
Implantable Lead Model: 5076
Implantable Pulse Generator Implant Date: 20220802
Lead Channel Impedance Value: 418 Ohm
Lead Channel Impedance Value: 475 Ohm
Lead Channel Impedance Value: 532 Ohm
Lead Channel Impedance Value: 589 Ohm
Lead Channel Pacing Threshold Amplitude: 0.375 V
Lead Channel Pacing Threshold Amplitude: 0.75 V
Lead Channel Pacing Threshold Pulse Width: 0.4 ms
Lead Channel Pacing Threshold Pulse Width: 0.4 ms
Lead Channel Sensing Intrinsic Amplitude: 2.375 mV
Lead Channel Sensing Intrinsic Amplitude: 30.375 mV
Lead Channel Setting Pacing Amplitude: 1.5 V
Lead Channel Setting Pacing Amplitude: 2 V
Lead Channel Setting Pacing Pulse Width: 0.4 ms
Lead Channel Setting Sensing Sensitivity: 2.8 mV

## 2020-12-24 NOTE — Patient Instructions (Addendum)
Medication Instructions:  Your physician recommends that you continue on your current medications as directed. Please refer to the Current Medication list given to you today. *If you need a refill on your cardiac medications before your next appointment, please call your pharmacy*  Lab Work: None. If you have labs (blood work) drawn today and your tests are completely normal, you will receive your results only by: Susan Moore (if you have MyChart) OR A paper copy in the mail If you have any lab test that is abnormal or we need to change your treatment, we will call you to review the results.  Testing/Procedures: None.  Follow-Up: At [facility], you and your health needs are our priority.  As part of our continuing mission to provide you with exceptional heart care, we have created designated Provider Care Teams.  These Care Teams include your primary Cardiologist (physician) and Advanced Practice Providers (APPs -  Physician Assistants and Nurse Practitioners) who all work together to provide you with the care you need, when you need it.  Your physician wants you to follow-up in: 12 months  one of the following Advanced Practice Providers on your designated Care Team:     Legrand Como "Jonni Sanger" Chalmers Cater, [place]   You will receive a reminder letter in the mail two months in advance. If you don't receive a letter, please call our office to schedule the follow-up appointment.  Remote monitoring is used to monitor your Pacemaker from home. This monitoring reduces the number of office visits required to check your device to one time per year. It allows [place] to keep an eye on the functioning of your device to ensure it is working properly. You are scheduled for a device check from home on 03/20/20. You may send your transmission at any time that day. If you have a wireless device, the transmission will be sent automatically. After your physician reviews your transmission, you will receive a postcard with  your next transmission date.  We recommend signing up for the patient portal called "MyChart".  Sign up information is provided on this After Visit Summary.  MyChart is used to connect with patients for Virtual Visits (Telemedicine).  Patients are able to view lab/test results, encounter notes, upcoming appointments, etc.  Non-urgent messages can be sent to your provider as well.   To learn more about what you can do with MyChart, go to [email or web address].    Any Other Special Instructions Will Be Listed Below (If Applicable).

## 2020-12-24 NOTE — Progress Notes (Signed)
    PCP: Martinique, [name], MD Primary Cardiologist: Dr Geraldo Pitter Primary EP:  Dr Peggyann Shoals is a 50 y.o. male who presents today for routine electrophysiology followup.  Since his PPM implant, the patient reports doing very well. Denies procedure related complications.  No further syncope.  Today, he denies symptoms of palpitations, chest pain, shortness of breath,  lower extremity edema.  The patient is otherwise without complaint today.   Past Medical History:  Diagnosis Date   Allergy    ARTHRALGIA 03/07/2010   Qualifier: Diagnosis of  By: Elizebeth Koller MD, Mora Appl    Arthritis    Chest tightness 06/27/2020   Chicken pox    Coronary artery disease 09/05/2020   DOE (dyspnea on exertion) 06/27/2020   Headache    Left bundle branch block    Left bundle branch block (LBBB) 12/29/2016   Mixed dyslipidemia 10/28/2019   Non-traumatic mid back pain 04/24/2016   Nonsustained ventricular tachycardia 09/05/2020   Syncope 11/28/2016   Syncope and collapse 09/17/2020   Past Surgical History:  Procedure Laterality Date   PACEMAKER IMPLANT N/A 09/18/2020   Procedure: PACEMAKER IMPLANT;  Surgeon: Thompson Grayer, MD;  Location: [facility];  Service: Cardiovascular;  Laterality: N/A;   WRIST SURGERY  2003   broken hand/wrist    ROS- all systems are reviewed and negative except as per HPI above  Current Outpatient Medications  Medication Sig Dispense Refill   Acetaminophen (TYLENOL PO) Take 1 tablet by mouth as needed (pain).     diphenhydrAMINE (BENADRYL) 25 MG tablet Take 25 mg by mouth at bedtime as needed for allergies or sleep.     ibuprofen (ADVIL,MOTRIN) 800 MG tablet Take 800 mg by mouth every 8 (eight) hours as needed for headache.     loratadine (CLARITIN) 10 MG tablet Take 10 mg by mouth daily as needed for allergies.     rosuvastatin (CRESTOR) 10 MG tablet Take 1 tablet (10 mg total) by mouth daily. 90 tablet 3   No current facility-administered medications for this  visit.    Physical Exam: Vitals:   12/24/20 1419  BP: 110/72  Pulse: 93  SpO2: 97%  Weight: 205 lb 9.6 oz (93.3 kg)  Height: 5\' 9"  (1.753 m)    GEN- The patient is well appearing, alert and oriented x 3 today.   Head- normocephalic, atraumatic Eyes-  Sclera clear, conjunctiva pink Ears- hearing intact Oropharynx- clear Lungs- Clear to ausculation bilaterally, normal work of breathing Chest- pacemaker pocket is well healed Heart- Regular rate and rhythm, no murmurs, rubs or gallops, PMI not laterally displaced GI- soft, NT, ND, + BS Extremities- no clubbing, cyanosis, or edema  Pacemaker interrogation- reviewed in detail today,  See PACEART report  ekg tracing ordered today is personally reviewed and shows sinus with conduction system pacing  Assessment and Plan:  1. Symptomatic complete heart block (transient) with recurrent syncope Normal pacemaker function See Claudia Desanctis Art report He has intrinsic 1:1 conduction. MVP turned on today to reduce V pacing and promote intrinsic conduction he is not device dependant today We discussed the above change at length.  He is aware that should he have any clinical worsening that he should contact our office and we will return to conduction system pacing.  2. CAD No ischemic symptoms No changes  Return to see EP APP in a year  Thompson Grayer MD, [facility] 12/24/2020 2:32 PM

## 2020-12-25 NOTE — Progress Notes (Signed)
Remote pacemaker transmission.   

## 2020-12-31 NOTE — Progress Notes (Signed)
Cardiology Office Note:    Date:  01/01/2021   ID:  Joel Lynch, DOB 20-Jul-1970, MRN [id number]  PCP:  Martinique, [name], MD  Cardiologist:  Early Osmond, MD  Electrophysiologist:  None   Referring MD: Martinique, [name], MD   Chief Complaint/Reason for Referral: Cardiology follow up  History of Present Illness:    PROBLEM LIST: 1.  Mild obstructive coronary artery disease and coronary CTA June 2022 2.  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  Hyperlipidemia 4.  Hypertension 5.  Left bundle branch block   Joel Lynch is a 50 y.o. male with the indicated history here for general cardiology follow-up.  The patient had a pacemaker placed due to syncope and high-grade heart block.  He had a dual-chamber device placed.  He was doing well when he was seen by EP recently.  He is doing better than prior.  His fatigue level is better.  He denies any exertional angina, palpitations, paroxysmal nocturnal dyspnea, presyncope, syncope, peripheral edema, or need for hospitalization or emergency room visit.  He is otherwise well without significant complaints.  Past Medical History:  Diagnosis Date   Allergy    ARTHRALGIA 03/07/2010   Qualifier: Diagnosis of  By: Elizebeth Koller MD, Mora Appl    Arthritis    Chest tightness 06/27/2020   Chicken pox    Coronary artery disease 09/05/2020   DOE (dyspnea on exertion) 06/27/2020   Headache    Left bundle branch block    Left bundle branch block (LBBB) 12/29/2016   Mixed dyslipidemia 10/28/2019   Non-traumatic mid back pain 04/24/2016   Nonsustained ventricular tachycardia 09/05/2020   Syncope 11/28/2016   Syncope and collapse 09/17/2020    Past Surgical History:  Procedure Laterality Date   PACEMAKER IMPLANT N/A 09/18/2020   Procedure: PACEMAKER IMPLANT;  Surgeon: Thompson Grayer, MD;  Location: [place] [facility];  Service: Cardiovascular;  Laterality: N/A;   WRIST SURGERY  2003   broken  hand/wrist    Current Medications: Current Meds  Medication Sig   Acetaminophen (TYLENOL PO) Take 1 tablet by mouth as needed (pain).   aspirin EC 81 MG tablet Take 1 tablet (81 mg total) by mouth daily. Swallow whole.   diphenhydrAMINE (BENADRYL) 25 MG tablet Take 25 mg by mouth at bedtime as needed for allergies or sleep.   ibuprofen (ADVIL,MOTRIN) 800 MG tablet Take 800 mg by mouth every 8 (eight) hours as needed for headache.   loratadine (CLARITIN) 10 MG tablet Take 10 mg by mouth daily as needed for allergies.     Allergies:   No Known Allergies  Social History   Tobacco Use   Smoking status: Never   Smokeless tobacco: Never  Vaping Use   Vaping Use: Never used  Substance Use Topics   Alcohol use: Yes    Comment: 3 - 4 times per week    Drug use: No     Family History: Family History  Problem Relation Age of Onset   Hyperlipidemia Mother    Diabetes Mother    Arthritis Father    Cancer Father 81       hodgkins   Stroke Maternal Grandmother    Arthritis Paternal Grandmother    Hypertension Paternal Grandmother    Heart disease Paternal Grandmother      ROS:   Please see the history of present illness.    All other systems reviewed and are negative.  EKGs/Labs/Other Studies Reviewed:    The following studies were reviewed today:  EKG: Last EKG from November 2022 demonstrates sinus rhythm with a left bundle branch block (possibly A sensed V paced).  Imaging studies that I have independently reviewed today:   TTE 7/22  1. Left ventricular ejection fraction, by estimation, is 60 to 65%. The  left ventricle has normal function. The left ventricle has no regional  wall motion abnormalities. Left ventricular diastolic parameters were  normal.   2. The mitral valve is normal in structure. Mild mitral valve  regurgitation. No evidence of mitral stenosis.   3. The aortic valve is normal in structure. Aortic valve regurgitation is  mild. No aortic stenosis is  present.  CT Coronary 6/22 1. Coronary calcium score of 17. This was 78th percentile for age, sex, and race matched control. 2. Normal coronary origin with left dominance. 3. There is notable septal hypertrophy: 16 mm consider correlation with echocardiogram if clinically indicated. 4. CAD-RADS 2. Mild non-obstructive CAD (25-49%). Consider non-atherosclerotic causes of chest pain. Consider preventive therapy and risk factor modification.  Recent Labs: 09/17/2020: ALT 31; B Natriuretic Peptide 37.1; Magnesium 2.2 09/18/2020: Hemoglobin 15.2; Platelets 166; TSH 3.382 10/24/2020: BUN 10; Creatinine, Ser 0.99; Potassium 4.3; Sodium 139  Recent Lipid Panel    Component Value Date/Time   CHOL 172 08/31/2020 0806   TRIG 95 08/31/2020 0806   HDL 45 08/31/2020 0806   CHOLHDL 3.8 08/31/2020 0806   CHOLHDL 4 05/10/2012 1454   VLDL 17.2 05/10/2012 1454   LDLCALC 109 (H) 08/31/2020 0806    Physical Exam:    VS:  BP 104/70   Pulse 72   Ht 5\' 9"  (1.753 m)   Wt 202 lb (91.6 kg)   SpO2 98%   BMI 29.83 kg/m     Wt Readings from Last 5 Encounters:  01/01/21 202 lb (91.6 kg)  12/24/20 205 lb 9.6 oz (93.3 kg)  11/06/20 203 lb (92.1 kg)  10/24/20 203 lb 6.4 oz (92.3 kg)  09/17/20 200 lb (90.7 kg)    GENERAL:  No apparent distress, AOx3 HEENT:  No carotid bruits, +2 carotid impulses, no scleral icterus CAR: RRR no murmurs, gallops, rubs, or thrills RES:  Clear to auscultation bilaterally ABD:  Soft, nontender, nondistended, positive bowel sounds x 4 VASC:  +2 radial pulses, +2 carotid pulses, palpable pedal pulses NEURO:  CN 2-12 grossly intact; motor and sensory grossly intact PSYCH:  No active depression or anxiety EXT:  No edema, ecchymosis, or cyanosis  ASSESSMENT:    1. Coronary artery disease involving native coronary artery of native heart without angina pectoris   2. Hypertension, unspecified type   3. Hyperlipidemia, unspecified hyperlipidemia type   4. Pacemaker   5. Left  bundle branch block    PLAN:    Coronary artery disease involving native coronary artery of native heart without angina pectoris Continue aspirin.  Follow-up in 1 year or earlier if needed.  Hypertension, unspecified type BP is well controlled.  Hyperlipidemia, unspecified hyperlipidemia type Check lipid panel and LFTs, would like LDL < 100 and < 70 if possible.    Pacemaker This is being followed by electrophysiology.  Left bundle branch block If symptoms in the future dictate we will obtain an echocardiogram.  If he is pacing quite frequently and his ejection fraction decreases he may need a cardiac resynchronization therapy.   Shared Decision Making/Informed Consent:       Medication Adjustments/Labs and Tests Ordered: Current medicines are  reviewed at length with the patient today.  Concerns regarding medicines are outlined above.   Orders Placed This Encounter  Procedures   Hepatic function panel   Lipid panel     Meds ordered this encounter  Medications   aspirin EC 81 MG tablet    Sig: Take 1 tablet (81 mg total) by mouth daily. Swallow whole.    Dispense:  90 tablet    Refill:  3     Patient Instructions  Medication Instructions:  Your physician has recommended you make the following change in your medication:  1.) aspirin 81 mg - take one tablet by mouth daily - use enteric coated (EC), not chewable aspirin  *If you need a refill on your cardiac medications before your next appointment, please call your pharmacy*   Lab Work: Today: lipids, liver function test  If you have labs (blood work) drawn today and your tests are completely normal, you will receive your results only by: [place] (if you have MyChart) OR A paper copy in the mail If you have any lab test that is abnormal or we need to change your treatment, we will call you to review the results.   Testing/Procedures: none   Follow-Up: At [facility], you and your health needs  are our priority.  As part of our continuing mission to provide you with exceptional heart care, we have created designated Provider Care Teams.  These Care Teams include your primary Cardiologist (physician) and Advanced Practice Providers (APPs -  Physician Assistants and Nurse Practitioners) who all work together to provide you with the care you need, when you need it.  with MyChart, go to [email or web address].    Your next appointment:   6 month(s)  The format for your next appointment:   In Person  Provider:   Early Osmond, MD     Other Instructions

## 2021-01-01 ENCOUNTER — Other Ambulatory Visit: Payer: Self-pay

## 2021-01-01 ENCOUNTER — Encounter: Payer: Self-pay | Admitting: Internal Medicine

## 2021-01-01 ENCOUNTER — Ambulatory Visit: Payer: BC Managed Care – PPO | Admitting: Internal Medicine

## 2021-01-01 VITALS — BP 104/70 | HR 72 | Ht 69.0 in | Wt 202.0 lb

## 2021-01-01 DIAGNOSIS — I10 Essential (primary) hypertension: Secondary | ICD-10-CM

## 2021-01-01 DIAGNOSIS — E785 Hyperlipidemia, unspecified: Secondary | ICD-10-CM

## 2021-01-01 DIAGNOSIS — I447 Left bundle-branch block, unspecified: Secondary | ICD-10-CM

## 2021-01-01 DIAGNOSIS — Z950 Presence of cardiac pacemaker: Secondary | ICD-10-CM | POA: Diagnosis not present

## 2021-01-01 DIAGNOSIS — I2510 Atherosclerotic heart disease of native coronary artery without angina pectoris: Secondary | ICD-10-CM | POA: Diagnosis not present

## 2021-01-01 LAB — HEPATIC FUNCTION PANEL
ALT: 22 IU/L (ref 0–44)
AST: 21 IU/L (ref 0–40)
Albumin: 4.5 g/dL (ref 4.0–5.0)
Alkaline Phosphatase: 77 IU/L (ref 44–121)
Bilirubin Total: 0.7 mg/dL (ref 0.0–1.2)
Bilirubin, Direct: 0.18 mg/dL (ref 0.00–0.40)
Total Protein: 7.1 g/dL (ref 6.0–8.5)

## 2021-01-01 LAB — LIPID PANEL
Chol/HDL Ratio: 2.9 ratio (ref 0.0–5.0)
Cholesterol, Total: 134 mg/dL (ref 100–199)
HDL: 46 mg/dL (ref >39)
LDL Chol Calc (NIH): 65 mg/dL (ref 0–99)
Triglycerides: 127 mg/dL (ref 0–149)
VLDL Cholesterol Cal: 23 mg/dL (ref 5–40)

## 2021-01-01 MED ORDER — ASPIRIN EC 81 MG PO TBEC: 81.0000 mg | DELAYED_RELEASE_TABLET | Freq: Every day | ORAL | 3 refills | Status: AC

## 2021-01-01 NOTE — Patient Instructions (Signed)
Medication Instructions:  Your physician has recommended you make the following change in your medication:  1.) aspirin 81 mg - take one tablet by mouth daily - use enteric coated (EC), not chewable aspirin  *If you need a refill on your cardiac medications before your next appointment, please call your pharmacy*   Lab Work: Today: lipids, liver function test  If you have labs (blood work) drawn today and your tests are completely normal, you will receive your results only by: [place] (if you have MyChart) OR A paper copy in the mail If you have any lab test that is abnormal or we need to change your treatment, we will call you to review the results.   Testing/Procedures: none   Follow-Up: At [facility], you and your health needs are our priority.  As part of our continuing mission to provide you with exceptional heart care, we have created designated Provider Care Teams.  These Care Teams include your primary Cardiologist (physician) and Advanced Practice Providers (APPs -  Physician Assistants and Nurse Practitioners) who all work together to provide you with the care you need, when you need it.  with MyChart, go to [email or web address].    Your next appointment:   6 month(s)  The format for your next appointment:   In Person  Provider:   Early Osmond, MD     Other Instructions

## 2021-02-04 NOTE — Progress Notes (Signed)
HPI: [name] is a 50 y.o.male here today for his routine physical examination.  Last CPE: > a year ago.  Regular exercise 3 or more times per week: Walking a few times per week. Following a healthy diet: He does not eat a lot of sweets, decreased salt intake.  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 Since his last visit he has established with new cardiologist.  Immunization History  Administered Date(s) Administered   Influenza,inj,Quad PF,6+ Mos 12/31/2018, 11/06/2020   Td 03/07/2010   Tdap 02/05/2021   Health Maintenance  Topic Date Due   COLONOSCOPY (Pts 45-72yrs Insurance coverage will need to be confirmed)  Never done   COVID-19 Vaccine (1) 02/21/2021 (Originally 04/19/1971)   TETANUS/TDAP  02/06/2031   INFLUENZA VACCINE  Completed   Hepatitis C Screening  Completed   HIV Screening  Completed   HPV VACCINES  Aged Out   Pneumococcal Vaccine 48-20 Years old  Discontinued   -Negative for high alcohol intake or tobacco use.  -Concerns and/or follow up today:  Bilateral shoulder pain, problem has been going on for a while. No hx of trauma. It is exacerbated by movement. No erythema or edema.  Review of Systems  Constitutional:  Positive for fatigue. Negative for activity change, appetite change and fever.  HENT:  Negative for mouth sores, nosebleeds, sore throat, trouble swallowing and voice change.   Eyes:  Negative for redness and visual disturbance.  Respiratory:  Negative for apnea, cough, shortness of breath and wheezing.   Cardiovascular:  Negative for chest pain, palpitations and leg swelling.  Gastrointestinal:  Negative for abdominal pain, blood in stool, nausea and vomiting.  Endocrine: Negative for cold intolerance, heat intolerance, polydipsia, polyphagia and polyuria.  Genitourinary:  Negative for decreased urine volume, dysuria, genital sores, hematuria and testicular pain.  Musculoskeletal:  Positive  for arthralgias. Negative for gait problem and myalgias.  Skin:  Negative for color change and rash.  Allergic/Immunologic: Positive for environmental allergies.  Neurological:  Negative for syncope, weakness and headaches.  Hematological:  Negative for adenopathy. Does not bruise/bleed easily.  Psychiatric/Behavioral:  Negative for behavioral problems and confusion.   All other systems reviewed and are negative.  Current Outpatient Medications on File Prior to Visit  Medication Sig Dispense Refill   Acetaminophen (TYLENOL PO) Take 1 tablet by mouth as needed (pain).     aspirin EC 81 MG tablet Take 1 tablet (81 mg total) by mouth daily. Swallow whole. 90 tablet 3   diphenhydrAMINE (BENADRYL) 25 MG tablet Take 25 mg by mouth at bedtime as needed for allergies or sleep.     ibuprofen (ADVIL,MOTRIN) 800 MG tablet Take 800 mg by mouth every 8 (eight) hours as needed for headache.     loratadine (CLARITIN) 10 MG tablet Take 10 mg by mouth daily as needed for allergies.     rosuvastatin (CRESTOR) 10 MG tablet Take 1 tablet (10 mg total) by mouth daily. 90 tablet 3   No current facility-administered medications on file prior to visit.   Past Medical History:  Diagnosis Date   Allergy    ARTHRALGIA 03/07/2010   Qualifier: Diagnosis of  By: Elizebeth Koller MD, Mora Appl    Arthritis    Chest tightness 06/27/2020   Chicken pox    Coronary artery disease 09/05/2020   DOE (dyspnea on exertion) 06/27/2020   Headache    Left bundle branch block    Left bundle branch block (LBBB) 12/29/2016   Mixed  dyslipidemia 10/28/2019   Non-traumatic mid back pain 04/24/2016   Nonsustained ventricular tachycardia 09/05/2020   Syncope 11/28/2016   Syncope and collapse 09/17/2020   Past Surgical History:  Procedure Laterality Date   PACEMAKER IMPLANT N/A 09/18/2020   Procedure: PACEMAKER IMPLANT;  Surgeon: Thompson Grayer, MD;  Location: [facility];  Service: Cardiovascular;  Laterality: N/A;   WRIST SURGERY  2003    broken hand/wrist   No Known Allergies  Family History  Problem Relation Age of Onset   Hyperlipidemia Mother    Diabetes Mother    Arthritis Father    Cancer Father 15       hodgkins   Stroke Maternal Grandmother    Arthritis Paternal Grandmother    Hypertension Paternal Grandmother    Heart disease Paternal Grandmother     Social History   Socioeconomic History   Marital status: Married    Spouse name: Not on file   Number of children: 1   Years of education: PhD   Highest education level: Not on file  Occupational History   Occupation: professor of Doctor, hospital: [employer]  Tobacco Use   Smoking status: Never   Smokeless tobacco: Never  Vaping Use   Vaping Use: Never used  Substance and Sexual Activity   Alcohol use: Yes    Comment: 3 - 4 times per week    Drug use: No   Sexual activity: Not on file  Other Topics Concern   Not on file  Social History Narrative   Lives with wife and child in a one story home.  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  Education: Phd   Social Determinants of Radio broadcast assistant Strain: Not on file  Food Insecurity: Not on file  Transportation Needs: Not on file  Physical Activity: Not on file  Stress: Not on file  Social Connections: Not on file   Vitals:   02/05/21 0948  BP: 126/70  Pulse: 70  Resp: 16  Temp: 98.2 F (36.8 C)  SpO2: 97%   Body mass index is 30.33 kg/m.  Wt Readings from Last 3 Encounters:  02/05/21 205 lb 6 oz (93.2 kg)  01/01/21 202 lb (91.6 kg)  12/24/20 205 lb 9.6 oz (93.3 kg)   Physical Exam Vitals and nursing note reviewed.  Constitutional:      General: He is not in acute distress.    Appearance: He is well-developed and well-groomed.  HENT:     Head: Normocephalic and atraumatic.     Right Ear: Tympanic membrane, ear canal and external ear normal.     Left Ear: Tympanic membrane, ear canal and external ear normal.     Mouth/Throat:     Mouth: Mucous  membranes are moist.     Pharynx: Oropharynx is clear.  Eyes:     Extraocular Movements: Extraocular movements intact.     Conjunctiva/sclera: Conjunctivae normal.     Pupils: Pupils are equal, round, and reactive to light.  Neck:     Thyroid: No thyromegaly.     Trachea: No tracheal deviation.  Cardiovascular:     Rate and Rhythm: Normal rate and regular rhythm.     Pulses:          Dorsalis pedis pulses are 2+ on the right side and 2+ on the left side.     Heart sounds: No murmur heard. Pulmonary:     Effort: Pulmonary effort is normal. No respiratory distress.  Breath sounds: Normal breath sounds.  Abdominal:     Palpations: Abdomen is soft. There is no hepatomegaly or mass.     Tenderness: There is no abdominal tenderness.  Genitourinary:    Comments: Refused,no concerns. Musculoskeletal:        General: No tenderness.     Right shoulder: No bony tenderness. Decreased range of motion. Normal pulse.     Left shoulder: No bony tenderness. Decreased range of motion. Normal pulse.     Cervical back: Normal range of motion.     Comments: No major deformities appreciated and no signs of synovitis. Bilateral shoulder: No deformity, edema, or erythema appreciated.No muscle atrophy. Luan Pulling' test pos, drop arm rotator cuff test pos, empty can supraspinatus test pos, lift-Off Subscapularis test pos. ROM with mildly limitation of abduction, bilateral.  Lymphadenopathy:     Cervical: No cervical adenopathy.     Upper Body:     Right upper body: No supraclavicular adenopathy.     Left upper body: No supraclavicular adenopathy.  Skin:    General: Skin is warm.     Findings: No erythema.  Neurological:     Mental Status: He is alert and oriented to person, place, and time.     Cranial Nerves: No cranial nerve deficit.     Sensory: No sensory deficit.     Coordination: Coordination normal.     Gait: Gait normal.     Deep Tendon Reflexes:     Reflex Scores:      Bicep reflexes  are 2+ on the right side and 2+ on the left side.      Patellar reflexes are 2+ on the right side and 2+ on the left side. Psychiatric:        Mood and Affect: Mood is not anxious or depressed.   ASSESSMENT AND PLAN:  Mr.Taiwan was seen today for annual exam.  Diagnoses and all orders for this visit: Orders Placed This Encounter  Procedures   Tdap vaccine greater than or equal to 7yo IM   Hepatitis C antibody screen   Hemoglobin A1c   Ambulatory referral to Physical Therapy   Ambulatory referral to Gastroenterology   Lab Results  Component Value Date   HGBA1C 5.6 02/05/2021   Routine general medical examination at a health care facility We discussed the importance of regular physical activity and healthy diet for prevention of chronic illness and/or complications. Preventive guidelines reviewed. Vaccination up to date. Next CPE in a year.  Encounter for HCV screening test for low risk patient -     Hepatitis C antibody screen  Impingement syndrome of both shoulders We discussed Dx and treatment options. Problem has been going on for sometimes, I think he will benefit from PT, referral placed.  Colon cancer screening -     Ambulatory referral to Gastroenterology  Screening for endocrine, metabolic and immunity disorder -     Hemoglobin A1c  Need for Tdap vaccination -     Tdap vaccine greater than or equal to 7yo IM  Return in 1 year (on 02/05/2022) for CPE.   Barbie Croston G. Martinique, MD  [facility]. [place] office.

## 2021-02-05 ENCOUNTER — Ambulatory Visit (INDEPENDENT_AMBULATORY_CARE_PROVIDER_SITE_OTHER): Payer: BC Managed Care – PPO | Admitting: Family Medicine

## 2021-02-05 ENCOUNTER — Encounter: Payer: Self-pay | Admitting: Family Medicine

## 2021-02-05 VITALS — BP 126/70 | HR 70 | Temp 98.2°F | Resp 16 | Ht 69.0 in | Wt 205.4 lb

## 2021-02-05 DIAGNOSIS — Z0000 Encounter for general adult medical examination without abnormal findings: Secondary | ICD-10-CM | POA: Diagnosis not present

## 2021-02-05 DIAGNOSIS — Z1211 Encounter for screening for malignant neoplasm of colon: Secondary | ICD-10-CM | POA: Diagnosis not present

## 2021-02-05 DIAGNOSIS — Z23 Encounter for immunization: Secondary | ICD-10-CM

## 2021-02-05 DIAGNOSIS — Z1159 Encounter for screening for other viral diseases: Secondary | ICD-10-CM

## 2021-02-05 DIAGNOSIS — Z13228 Encounter for screening for other metabolic disorders: Secondary | ICD-10-CM

## 2021-02-05 DIAGNOSIS — M7541 Impingement syndrome of right shoulder: Secondary | ICD-10-CM

## 2021-02-05 DIAGNOSIS — Z130 Encounter for screening for diseases of the blood and blood-forming organs and certain disorders involving the immune mechanism: Secondary | ICD-10-CM

## 2021-02-05 DIAGNOSIS — M7542 Impingement syndrome of left shoulder: Secondary | ICD-10-CM

## 2021-02-05 DIAGNOSIS — Z1329 Encounter for screening for other suspected endocrine disorder: Secondary | ICD-10-CM

## 2021-02-05 LAB — HEMOGLOBIN A1C: Hgb A1c MFr Bld: 5.6 % (ref 4.6–6.5)

## 2021-02-05 NOTE — Patient Instructions (Addendum)
A few things to remember from today's visit:  Routine general medical examination at a health care facility  Encounter for HCV screening test for low risk patient - Plan: Hepatitis C antibody screen  Impingement syndrome of both shoulders - Plan: Ambulatory referral to Physical Therapy  Colon cancer screening - Plan: Ambulatory referral to Gastroenterology  Screening for endocrine, metabolic and immunity disorder - Plan: Hemoglobin A1c  If you need refills please call your pharmacy. Do not use My Chart to request refills or for acute issues that need immediate attention.    Please be sure medication list is accurate. If a new problem present, please set up appointment sooner than planned today.  Health Maintenance, Male Adopting a healthy lifestyle and getting preventive care are important in promoting health and wellness. Ask your health care provider about: The right schedule for you to have regular tests and exams. Things you can do on your own to prevent diseases and keep yourself healthy. What should I know about diet, weight, and exercise? Eat a healthy diet  Eat a diet that includes plenty of vegetables, fruits, low-fat dairy products, and lean protein. Do not eat a lot of foods that are high in solid fats, added sugars, or sodium. Maintain a healthy weight Body mass index (BMI) is a measurement that can be used to identify possible weight problems. It estimates body fat based on height and weight. Your health care provider can help determine your BMI and help you achieve or maintain a healthy weight. Get regular exercise Get regular exercise. This is one of the most important things you can do for your health. Most adults should: Exercise for at least 150 minutes each week. The exercise should increase your heart rate and make you sweat (moderate-intensity exercise). Do strengthening exercises at least twice a week. This is in addition to the moderate-intensity  exercise. Spend less time sitting. Even light physical activity can be beneficial. Watch cholesterol and blood lipids Have your blood tested for lipids and cholesterol at 49 years of age, then have this test every 5 years. You may need to have your cholesterol levels checked more often if: Your lipid or cholesterol levels are high. You are older than 49 years of age. You are at high risk for heart disease. What should I know about cancer screening? Many types of cancers can be detected early and may often be prevented. Depending on your health history and family history, you may need to have cancer screening at various ages. This may include screening for: Colorectal cancer. Prostate cancer. Skin cancer. Lung cancer. What should I know about heart disease, diabetes, and high blood pressure? Blood pressure and heart disease High blood pressure causes heart disease and increases the risk of stroke. This is more likely to develop in people who have high blood pressure readings or are overweight. Talk with your health care provider about your target blood pressure readings. Have your blood pressure checked: Every 3-5 years if you are 69-14 years of age. Every year if you are 21 years old or older. If you are between the ages of 43 and 76 and are a current or former smoker, ask your health care provider if you should have a one-time screening for abdominal aortic aneurysm (AAA). Diabetes Have regular diabetes screenings. This checks your fasting blood sugar level. Have the screening done: Once every three years after age 3 if you are at a normal weight and have a low risk for diabetes. More often and  at a younger age if you are overweight or have a high risk for diabetes. What should I know about preventing infection? Hepatitis B If you have a higher risk for hepatitis B, you should be screened for this virus. Talk with your health care provider to find out if you are at risk for hepatitis B  infection. Hepatitis C Blood testing is recommended for: Everyone born from [birth year] through 1964. Anyone with known risk factors for hepatitis C. Sexually transmitted infections (STIs) You should be screened each year for STIs, including gonorrhea and chlamydia, if: You are sexually active and are younger than 49 years of age. You are older than 49 years of age and your health care provider tells you that you are at risk for this type of infection. Your sexual activity has changed since you were last screened, and you are at increased risk for chlamydia or gonorrhea. Ask your health care provider if you are at risk. Ask your health care provider about whether you are at high risk for HIV. Your health care provider may recommend a prescription medicine to help prevent HIV infection. If you choose to take medicine to prevent HIV, you should first get tested for HIV. You should then be tested every 3 months for as long as you are taking the medicine. Follow these instructions at home: Alcohol use Do not drink alcohol if your health care provider tells you not to drink. If you drink alcohol: Limit how much you have to 0-2 drinks a day. Know how much alcohol is in your drink. In the [place] one drink equals one 12 oz bottle of beer (355 mL), one 5 oz glass of wine (148 mL), or one 1 oz glass of hard liquor (44 mL). Lifestyle Do not use any products that contain nicotine or tobacco. These products include cigarettes, chewing tobacco, and vaping devices, such as e-cigarettes. If you need help quitting, ask your health care provider. Do not use street drugs. Do not share needles. Ask your health care provider for help if you need support or information about quitting drugs. General instructions Schedule regular health, dental, and eye exams. Stay current with your vaccines. Tell your health care provider if: You often feel depressed. You have ever been abused or do not feel safe at  home. Summary Adopting a healthy lifestyle and getting preventive care are important in promoting health and wellness. Follow your health care provider's instructions about healthy diet, exercising, and getting tested or screened for diseases. Follow your health care provider's instructions on monitoring your cholesterol and blood pressure. This information is not intended to replace advice given to you by your health care provider. Make sure you discuss any questions you have with your health care provider. Document Revised: 06/25/2020 Document Reviewed: 06/25/2020 Elsevier Patient Education  [place].

## 2021-02-06 LAB — HEPATITIS C ANTIBODY
Hepatitis C Ab: NONREACTIVE
SIGNAL TO CUT-OFF: <0.02 (ref <1.00)

## 2021-03-20 ENCOUNTER — Ambulatory Visit (INDEPENDENT_AMBULATORY_CARE_PROVIDER_SITE_OTHER): Payer: BC Managed Care – PPO

## 2021-03-20 DIAGNOSIS — I447 Left bundle-branch block, unspecified: Secondary | ICD-10-CM | POA: Diagnosis not present

## 2021-03-21 LAB — CUP PACEART REMOTE DEVICE CHECK
Battery Remaining Longevity: 161 mo
Battery Voltage: 3.15 V
Brady Statistic AP VP Percent: 0.01 %
Brady Statistic AP VS Percent: 3.2 %
Brady Statistic AS VP Percent: 0.04 %
Brady Statistic AS VS Percent: 96.75 %
Brady Statistic RA Percent Paced: 3.21 %
Brady Statistic RV Percent Paced: 0.05 %
Date Time Interrogation Session: 20230201181434
Implantable Lead Implant Date: 20220802
Implantable Lead Implant Date: 20220802
Implantable Lead Location: 753859
Implantable Lead Location: 753860
Implantable Lead Model: 3830
Implantable Lead Model: 5076
Implantable Pulse Generator Implant Date: 20220802
Lead Channel Impedance Value: 361 Ohm
Lead Channel Impedance Value: 418 Ohm
Lead Channel Impedance Value: 494 Ohm
Lead Channel Impedance Value: 513 Ohm
Lead Channel Pacing Threshold Amplitude: 0.5 V
Lead Channel Pacing Threshold Amplitude: 1 V
Lead Channel Pacing Threshold Pulse Width: 0.4 ms
Lead Channel Pacing Threshold Pulse Width: 0.4 ms
Lead Channel Sensing Intrinsic Amplitude: 2.375 mV
Lead Channel Sensing Intrinsic Amplitude: 2.375 mV
Lead Channel Sensing Intrinsic Amplitude: 24.375 mV
Lead Channel Sensing Intrinsic Amplitude: 24.375 mV
Lead Channel Setting Pacing Amplitude: 1.5 V
Lead Channel Setting Pacing Amplitude: 2 V
Lead Channel Setting Pacing Pulse Width: 0.4 ms
Lead Channel Setting Sensing Sensitivity: 2.8 mV

## 2021-03-27 NOTE — Progress Notes (Signed)
Remote pacemaker transmission.   

## 2021-04-23 ENCOUNTER — Encounter: Payer: Self-pay | Admitting: Gastroenterology

## 2021-04-23 ENCOUNTER — Ambulatory Visit: Payer: BC Managed Care – PPO | Admitting: Cardiology

## 2021-06-18 ENCOUNTER — Ambulatory Visit (AMBULATORY_SURGERY_CENTER): Payer: Self-pay | Admitting: *Deleted

## 2021-06-18 VITALS — Ht 69.0 in | Wt 200.0 lb

## 2021-06-18 DIAGNOSIS — Z1211 Encounter for screening for malignant neoplasm of colon: Secondary | ICD-10-CM

## 2021-06-18 MED ORDER — PEG 3350-KCL-NA BICARB-NACL 420 G PO SOLR: 4000.0000 mL | Freq: Once | ORAL | 0 refills | Status: AC

## 2021-06-18 NOTE — Progress Notes (Signed)

## 2021-06-19 ENCOUNTER — Encounter: Payer: Self-pay | Admitting: Gastroenterology

## 2021-06-19 ENCOUNTER — Ambulatory Visit (INDEPENDENT_AMBULATORY_CARE_PROVIDER_SITE_OTHER): Payer: BC Managed Care – PPO

## 2021-06-19 DIAGNOSIS — I4729 Other ventricular tachycardia: Secondary | ICD-10-CM | POA: Diagnosis not present

## 2021-06-20 LAB — CUP PACEART REMOTE DEVICE CHECK
Battery Remaining Longevity: 164 mo
Battery Voltage: 3.13 V
Brady Statistic AP VP Percent: 0.02 %
Brady Statistic AP VS Percent: 5.51 %
Brady Statistic AS VP Percent: 0.03 %
Brady Statistic AS VS Percent: 94.44 %
Brady Statistic RA Percent Paced: 5.53 %
Brady Statistic RV Percent Paced: 0.05 %
Date Time Interrogation Session: 20230503164133
Implantable Lead Implant Date: 20220802
Implantable Lead Implant Date: 20220802
Implantable Lead Location: 753859
Implantable Lead Location: 753860
Implantable Lead Model: 3830
Implantable Lead Model: 5076
Implantable Pulse Generator Implant Date: 20220802
Lead Channel Impedance Value: 361 Ohm
Lead Channel Impedance Value: 418 Ohm
Lead Channel Impedance Value: 513 Ohm
Lead Channel Impedance Value: 532 Ohm
Lead Channel Pacing Threshold Amplitude: 0.5 V
Lead Channel Pacing Threshold Amplitude: 1 V
Lead Channel Pacing Threshold Pulse Width: 0.4 ms
Lead Channel Pacing Threshold Pulse Width: 0.4 ms
Lead Channel Sensing Intrinsic Amplitude: 1.875 mV
Lead Channel Sensing Intrinsic Amplitude: 1.875 mV
Lead Channel Sensing Intrinsic Amplitude: 25.125 mV
Lead Channel Sensing Intrinsic Amplitude: 25.125 mV
Lead Channel Setting Pacing Amplitude: 1.5 V
Lead Channel Setting Pacing Amplitude: 2 V
Lead Channel Setting Pacing Pulse Width: 0.4 ms
Lead Channel Setting Sensing Sensitivity: 2.8 mV

## 2021-07-02 ENCOUNTER — Ambulatory Visit (AMBULATORY_SURGERY_CENTER): Payer: BC Managed Care – PPO | Admitting: Gastroenterology

## 2021-07-02 ENCOUNTER — Encounter: Payer: Self-pay | Admitting: Gastroenterology

## 2021-07-02 VITALS — BP 102/69 | HR 60 | Temp 98.1°F | Resp 18 | Ht 69.0 in | Wt 200.0 lb

## 2021-07-02 DIAGNOSIS — D123 Benign neoplasm of transverse colon: Secondary | ICD-10-CM

## 2021-07-02 DIAGNOSIS — Z1211 Encounter for screening for malignant neoplasm of colon: Secondary | ICD-10-CM | POA: Diagnosis present

## 2021-07-02 MED ORDER — SODIUM CHLORIDE 0.9 % IV SOLN: 500.0000 mL | Freq: Once | INTRAVENOUS | Status: DC

## 2021-07-02 NOTE — Progress Notes (Signed)
To pacu, VSS. Report to [email or web address] 

## 2021-07-02 NOTE — Progress Notes (Signed)
Pt was dx with bronchitis 06/06/21.  MD put pt on cefdinir, prednisone dose pack, cough med.  Pt finished meds and sx are gone.  Pt did report a rash in his groin he suspects from the antibiotic - cefdinir.  This was added to allergies.  Maw ? ?Pacemaker sign hanging on IV pole.  maw ?

## 2021-07-02 NOTE — Progress Notes (Signed)
? ?Referring Provider: Martinique, [name], MD ?Primary Care Physician:  Martinique, [name], MD ? ?Indication for Procedure:  Colon cancer screening ? ? ?IMPRESSION:  ?Need for colon cancer screening ?Appropriate candidate for monitored anesthesia care ? ?PLAN: ?Colonoscopy in the [place] today ? ? ?HPI: Joel Lynch is a 51 y.o. male presents for screening colonoscopy. ? ?No prior colonoscopy or colon cancer screening. ? ?No baseline GI symptoms.  ? ?Mother with colon polyps. No other known family history of colon cancer or polyps. No family history of uterine/endometrial cancer, pancreatic cancer or gastric/stomach cancer. ? ? ?Past Medical History:  ?Diagnosis Date  ? Allergy   ? seasonal  ? ARTHRALGIA 03/07/2010  ? Qualifier: Diagnosis of  By: Elizebeth Koller MD, Mora Appl   ? Arthritis   ? 'mild"  ? Chest tightness 06/27/2020  ? Chicken pox   ? Coronary artery disease 09/05/2020  ? DOE (dyspnea on exertion) 06/27/2020  ? Headache   ? Hyperlipidemia   ? Left bundle branch block   ? Left bundle branch block (LBBB) 12/29/2016  ? Mixed dyslipidemia 10/28/2019  ? Non-traumatic mid back pain 04/24/2016  ? Nonsustained ventricular tachycardia ([place]) 09/05/2020  ? Syncope 11/28/2016  ? Syncope and collapse 09/17/2020  ? Third degree heart block ([place])   ? ? ?Past Surgical History:  ?Procedure Laterality Date  ? PACEMAKER IMPLANT N/A 09/18/2020  ? Procedure: PACEMAKER IMPLANT;  Surgeon: Thompson Grayer, MD;  Location: [facility];  Service: Cardiovascular;  Laterality: N/A;  ? WISDOM TOOTH EXTRACTION    ? WRIST SURGERY  02/17/2001  ? broken hand/wrist  ? ? ?Current Outpatient Medications  ?Medication Sig Dispense Refill  ? ibuprofen (ADVIL,MOTRIN) 800 MG tablet Take 800 mg by mouth every 8 (eight) hours as needed for headache.    ? Acetaminophen (TYLENOL PO) Take 1 tablet by mouth as needed (pain).    ? aspirin EC 81 MG tablet Take 1 tablet (81 mg total) by mouth daily. Swallow whole. 90 tablet 3  ? diphenhydrAMINE (BENADRYL) 25 MG  tablet Take 25 mg by mouth daily.    ? loratadine (CLARITIN) 10 MG tablet Take 10 mg by mouth daily as needed for allergies.    ? rosuvastatin (CRESTOR) 10 MG tablet Take 1 tablet (10 mg total) by mouth daily. 90 tablet 3  ? ?Current Facility-Administered Medications  ?Medication Dose Route Frequency Provider Last Rate Last Admin  ? 0.9 %  sodium chloride infusion  500 mL Intravenous Once [place], MD      ? ? ?Allergies as of 07/02/2021 - Review Complete 07/02/2021  ?Allergen Reaction Noted  ? Cefdinir Rash 07/02/2021  ? ? ?Family History  ?Problem Relation Age of Onset  ? Colon polyps Mother   ? Hyperlipidemia Mother   ? Diabetes Mother   ? Arthritis Father   ? Cancer Father 32  ?     hodgkins  ? Stroke Maternal Grandmother   ? Arthritis Paternal Grandmother   ? Hypertension Paternal Grandmother   ? Heart disease Paternal Grandmother   ? Colon cancer Neg Hx   ? Crohn's disease Neg Hx   ? Esophageal cancer Neg Hx   ? Rectal cancer Neg Hx   ? Stomach cancer Neg Hx   ? ? ? ?Physical Exam: ?General:   Alert,  well-nourished, pleasant and cooperative in NAD ?Head:  Normocephalic and atraumatic. ?Eyes:  Sclera clear, no icterus.   Conjunctiva pink. ?Mouth:  No deformity or lesions.   ?Neck:  Supple; no masses or thyromegaly. ?Lungs:  Clear throughout to auscultation.   No wheezes. ?Heart:  Regular rate and rhythm; no murmurs. ?Abdomen:  Soft, non-tender, nondistended, normal bowel sounds, no rebound or guarding.  ?Msk:  Symmetrical. No boney deformities ?LAD: No inguinal or umbilical LAD ?Extremities:  No clubbing or edema. ?Neurologic:  Alert and  oriented x4;  grossly nonfocal ?Skin:  No obvious rash or bruise. ?Psych:  Alert and cooperative. Normal mood and affect. ? ? ? ? ?Studies/Results: ?No results found. ? ? ? ?Ellyson Rarick L. Tarri Glenn, MD, MPH ?07/02/2021, 1:06 PM ? ? ? ?  ?

## 2021-07-02 NOTE — Progress Notes (Signed)
Called to room to assist during endoscopic procedure.  Patient ID and intended procedure confirmed with present staff. Received instructions for my participation in the procedure from the performing physician.  

## 2021-07-02 NOTE — Op Note (Signed)
[place] ?Patient Name: Joel Lynch ?Procedure Date: 07/02/2021 1:19 PM ?MRN: [id number] ?Endoscopist: Thornton Park MD, MD ?Age: 51 ?Referring MD:  ?Date of Birth: 16-Jun-1970 ?Gender: Male ?Account #: [id number] ?Procedure:                Colonoscopy ?Indications:              Screening for colorectal malignant neoplasm, This  ?                          is the patient's first colonoscopy ?Medicines:                Monitored Anesthesia Care ?Procedure:                Pre-Anesthesia Assessment: ?                          - Prior to the procedure, a History and Physical  ?                          was performed, and patient medications and  ?                          allergies were reviewed. The patient's tolerance of  ?                          previous anesthesia was also reviewed. The risks  ?                          and benefits of the procedure and the sedation  ?                          options and risks were discussed with the patient.  ?                          All questions were answered, and informed consent  ?                          was obtained. Prior Anticoagulants: The patient has  ?                          taken no previous anticoagulant or antiplatelet  ?                          agents. ASA Grade Assessment: III - A patient with  ?                          severe systemic disease. After reviewing the risks  ?                          and benefits, the patient was deemed in  ?                          satisfactory condition to undergo the procedure. ?  After obtaining informed consent, the colonoscope  ?                          was passed under direct vision. Throughout the  ?                          procedure, the patient's blood pressure, pulse, and  ?                          oxygen saturations were monitored continuously. The  ?                          CF [id number] #[id number] was introduced through the anus  ?                          and advanced to the  3 cm into the ileum. A second  ?                          forward view of the right colon was performed. The  ?                          colonoscopy was performed without difficulty. The  ?                          patient tolerated the procedure well. The quality  ?                          of the bowel preparation was good. The terminal  ?                          ileum, ileocecal valve, appendiceal orifice, and  ?                          rectum were photographed. ?Scope In: 1:24:23 PM ?Scope Out: 1:41:51 PM ?Scope Withdrawal Time: 0 hours 15 minutes 18 seconds  ?Total Procedure Duration: 0 hours 17 minutes 28 seconds  ?Findings:                 The perianal and digital rectal examinations were  ?                          normal. ?                          Two sessile polyps were found in the transverse  ?                          colon. The polyps were 2 to 3 mm in size. These  ?                          polyps were removed with a cold snare. Resection  ?                          and retrieval were complete.  Estimated blood loss  ?                          was minimal. ?                          The exam was otherwise without abnormality on  ?                          direct and retroflexion views. ?Complications:            No immediate complications. ?Estimated Blood Loss:     Estimated blood loss was minimal. ?Impression:               - Two 2 to 3 mm polyps in the transverse colon,  ?                          removed with a cold snare. Resected and retrieved. ?                          - The examination was otherwise normal on direct  ?                          and retroflexion views. ?Recommendation:           - Patient has a contact number available for  ?                          emergencies. The signs and symptoms of potential  ?                          delayed complications were discussed with the  ?                          patient. Return to normal activities tomorrow.  ?                           Written discharge instructions were provided to the  ?                          patient. ?                          - Resume previous diet. ?                          - Continue present medications. ?                          - Await pathology results. ?                          - Repeat colonoscopy date to be determined after  ?                          pending pathology results are reviewed for  ?  surveillance. Would perform another colonoscopy in  ?                          5 years if there is any family history of  ?                          precancerous polyps. ?                          - Emerging evidence supports eating a diet of  ?                          fruits, vegetables, grains, calcium, and yogurt  ?                          while reducing red meat and alcohol may reduce the  ?                          risk of colon cancer. ?                          - Thank you for allowing me to be involved in your  ?                          colon cancer prevention. ?Thornton Park MD, MD ?07/02/2021 1:47:45 PM ?This report has been signed electronically. ?

## 2021-07-02 NOTE — Patient Instructions (Signed)
Resume previous diet and medications. °Awaiting pathology results. Repeat Colonoscopy date to be determined based on pathology results. ° °YOU HAD AN ENDOSCOPIC PROCEDURE TODAY AT THE [facility]:   Refer to the procedure report that was given to you for any specific questions about what was found during the examination.  If the procedure report does not answer your questions, please call your gastroenterologist to clarify.  If you requested that your care partner not be given the details of your procedure findings, then the procedure report has been included in a sealed envelope for you to review at your convenience later. ° °YOU SHOULD EXPECT: Some feelings of bloating in the abdomen. Passage of more gas than usual.  Walking can help get rid of the air that was put into your GI tract during the procedure and reduce the bloating. If you had a lower endoscopy (such as a colonoscopy or flexible sigmoidoscopy) you may notice spotting of blood in your stool or on the toilet paper. If you underwent a bowel prep for your procedure, you may not have a normal bowel movement for a few days. ° °Please Note:  You might notice some irritation and congestion in your nose or some drainage.  This is from the oxygen used during your procedure.  There is no need for concern and it should clear up in a day or so. ° °SYMPTOMS TO REPORT IMMEDIATELY: ° °Following lower endoscopy (colonoscopy or flexible sigmoidoscopy): ° Excessive amounts of blood in the stool ° Significant tenderness or worsening of abdominal pains ° Swelling of the abdomen that is new, acute ° Fever of 100°F or higher ° °For urgent or emergent issues, a gastroenterologist can be reached at any hour by calling [phone]. °Do not use MyChart messaging for urgent concerns.  ° ° °DIET:  We do recommend a small meal at first, but then you may proceed to your regular diet.  Drink plenty of fluids but you should avoid alcoholic beverages for 24  hours. ° °ACTIVITY:  You should plan to take it easy for the rest of today and you should NOT DRIVE or use heavy machinery until tomorrow (because of the sedation medicines used during the test).   ° °FOLLOW UP: °Our staff will call the number listed on your records 48-72 hours following your procedure to check on you and address any questions or concerns that you may have regarding the information given to you following your procedure. If we do not reach you, we will leave a message.  We will attempt to reach you two times.  During this call, we will ask if you have developed any symptoms of COVID 19. If you develop any symptoms (ie: fever, flu-like symptoms, shortness of breath, cough etc.) before then, please call [phone].  If you test positive for Covid 19 in the 2 weeks post procedure, please call and report this information to us.   ° °If any biopsies were taken you will be contacted by phone or by letter within the next 1-3 weeks.  Please call us at [phone] if you have not heard about the biopsies in 3 weeks.  ° ° °SIGNATURES/CONFIDENTIALITY: °You and/or your care partner have signed paperwork which will be entered into your electronic medical record.  These signatures attest to the fact that that the information above on your After Visit Summary has been reviewed and is understood.  Full responsibility of the confidentiality of this discharge information lies with you and/or your care-partner.  °

## 2021-07-03 NOTE — Progress Notes (Signed)
Remote pacemaker transmission.   

## 2021-07-04 ENCOUNTER — Telehealth: Payer: Self-pay

## 2021-07-04 ENCOUNTER — Encounter: Payer: Self-pay | Admitting: Internal Medicine

## 2021-07-04 ENCOUNTER — Ambulatory Visit: Payer: BC Managed Care – PPO | Admitting: Internal Medicine

## 2021-07-04 VITALS — BP 110/74 | HR 81 | Ht 69.0 in | Wt 201.8 lb

## 2021-07-04 DIAGNOSIS — I10 Essential (primary) hypertension: Secondary | ICD-10-CM | POA: Diagnosis not present

## 2021-07-04 DIAGNOSIS — I447 Left bundle-branch block, unspecified: Secondary | ICD-10-CM

## 2021-07-04 DIAGNOSIS — I2510 Atherosclerotic heart disease of native coronary artery without angina pectoris: Secondary | ICD-10-CM

## 2021-07-04 DIAGNOSIS — Z950 Presence of cardiac pacemaker: Secondary | ICD-10-CM | POA: Diagnosis not present

## 2021-07-04 DIAGNOSIS — E785 Hyperlipidemia, unspecified: Secondary | ICD-10-CM | POA: Diagnosis not present

## 2021-07-04 NOTE — Patient Instructions (Signed)
Medication Instructions:  NO CHANGES *If you need a refill on your cardiac medications before your next appointment, please call your pharmacy*   Lab Work: NONE If you have labs (blood work) drawn today and your tests are completely normal, you will receive your results only by: [place] (if you have MyChart) OR A paper copy in the mail If you have any lab test that is abnormal or we need to change your treatment, we will call you to review the results.   Testing/Procedures: NONE   Follow-Up: At [facility], you and your health needs are our priority.  As part of our continuing mission to provide you with exceptional heart care, we have created designated Provider Care Teams.  These Care Teams include your primary Cardiologist (physician) and Advanced Practice Providers (APPs -  Physician Assistants and Nurse Practitioners) who all work together to provide you with the care you need, when you need it.  We recommend signing up for the patient portal called "MyChart".  Sign up information is provided on this After Visit Summary.  MyChart is used to connect with patients for Virtual Visits (Telemedicine).  Patients are able to view lab/test results, encounter notes, upcoming appointments, etc.  Non-urgent messages can be sent to your provider as well.   To learn more about what you can do with MyChart, go to [email or web address].    Your next appointment:   1 year(s)  The format for your next appointment:   In Person  Provider:   DR Ali Lowe     Other Instructions NONE  Important Information About Sugar

## 2021-07-04 NOTE — Telephone Encounter (Signed)
  Follow up Call-     07/02/2021   12:38 PM  Call back number  Post procedure Call Back phone  # [phone] cell  Permission to leave phone message Yes     Patient questions:  Do you have a fever, pain , or abdominal swelling? No. Pain Score  0 *  Have you tolerated food without any problems? Yes.    Have you been able to return to your normal activities? Yes.    Do you have any questions about your discharge instructions: Diet   No. Medications  No. Follow up visit  No.  Do you have questions or concerns about your Care? No.  Actions: * If pain score is 4 or above: No action needed, pain <4.

## 2021-07-04 NOTE — Progress Notes (Signed)
Cardiology Office Note:    Date:  07/04/2021   ID:  Joel Lynch, DOB Sep 21, 1970, MRN [id number]  PCP:  [place], [name], MD   [place] Providers Cardiologist:  Lenna Sciara, MD Referring MD: [place], [name], MD   Chief Complaint/Reason for Referral: Cardiology follow-up  ASSESSMENT:    1. Coronary artery disease involving native coronary artery of native heart without angina pectoris   2. Hypertension, unspecified type   3. Hyperlipidemia, unspecified hyperlipidemia type   4. Pacemaker   5. Left bundle branch block     PLAN:    In order of problems listed above: 1.  Coronary artery disease: This is mild on previous coronary angiography, continue aspirin and statin.  Follow-up in 1 year or earlier if needed. 2.  Hypertension: Patient blood pressure well controlled. 3.  Hyperlipidemia: Patient's LDL was less than 70.  We will plan on checking this again the next time we see him. 4.  Pacemaker: Recent device transmission showed very minimal a pacing. 5.  Left bundle branch block: Monitor for now.  No need for CRT.             Dispo:  Return in about 1 year (around 07/05/2022).      Medication Adjustments/Labs and Tests Ordered: Current medicines are reviewed at length with the patient today.  Concerns regarding medicines are outlined above.  The following changes have been made:  no change   Labs/tests ordered: No orders of the defined types were placed in this encounter.   Medication Changes: No orders of the defined types were placed in this encounter.    Current medicines are reviewed at length with the patient today.  The patient does not have concerns regarding medicines.   History of Present Illness:    FOCUSED PROBLEM LIST:   1.  Mild obstructive coronary artery disease and coronary CTA June 2022 2.  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  Hyperlipidemia 4.  Hypertension 5.  Left  bundle branch block  The patient is a 51 y.o. male with the indicated medical history here for for cardiology follow-up.    He was last seen in November.  At that time he was doing well.  A lipid panel was drawn which was at goal of 65.  Today: Continues to do well with no significant cardiovascular complaints.  Actually feels like he has more energy and focus now.  He did have an urgent care visit for bronchitis but no emergency room visits for any other significant issues.         Current Medications: Current Meds  Medication Sig   Acetaminophen (TYLENOL PO) Take 1 tablet by mouth as needed (pain).   aspirin EC 81 MG tablet Take 1 tablet (81 mg total) by mouth daily. Swallow whole.   diphenhydrAMINE (BENADRYL) 25 MG tablet Take 25 mg by mouth daily.   ibuprofen (ADVIL,MOTRIN) 800 MG tablet Take 800 mg by mouth every 8 (eight) hours as needed for headache.   loratadine (CLARITIN) 10 MG tablet Take 10 mg by mouth daily as needed for allergies.     Allergies:    Cefdinir   Social History:   Social History   Tobacco Use   Smoking status: Never   Smokeless tobacco: Never  Vaping Use   Vaping Use: Never used  Substance Use Topics   Alcohol use: Yes    Comment: 3 - 4 times per week  Drug use: No     Family Hx: Family History  Problem Relation Age of Onset   Colon polyps Mother    Hyperlipidemia Mother    Diabetes Mother    Arthritis Father    Cancer Father 62       hodgkins   Stroke Maternal Grandmother    Arthritis Paternal Grandmother    Hypertension Paternal Grandmother    Heart disease Paternal Grandmother    Colon cancer Neg Hx    Crohn's disease Neg Hx    Esophageal cancer Neg Hx    Rectal cancer Neg Hx    Stomach cancer Neg Hx      Review of Systems:   Please see the history of present illness.    All other systems reviewed and are negative.     EKGs/Labs/Other Test Reviewed:    EKG:  EKG performed November 2022 that I personally reviewed  demonstrates sinus with V pacing  Prior CV studies: TTE 2022 demonstrates ejection fraction of 60 to 65% with no significant valvular abnormalities  Calcium score CT 2022 shows mild obstructive disease with a calcium score of 17  Cardiac MRI 2022 demonstrates mild asymmetric left ventricular hypertrophy with a basal septum of the 12 mm without criteria for hypertrophic obstructive cardiomyopathy and no LGE with ejection fraction of 51%  Other studies Reviewed: Review of the additional studies/records demonstrates: None relevant  Recent Labs: 09/17/2020: B Natriuretic Peptide 37.1; Magnesium 2.2 09/18/2020: Hemoglobin 15.2; Platelets 166; TSH 3.382 10/24/2020: BUN 10; Creatinine, Ser 0.99; Potassium 4.3; Sodium 139 01/01/2021: ALT 22   Recent Lipid Panel Lab Results  Component Value Date/Time   CHOL 134 01/01/2021 09:30 AM   TRIG 127 01/01/2021 09:30 AM   HDL 46 01/01/2021 09:30 AM   LDLCALC 65 01/01/2021 09:30 AM    Risk Assessment/Calculations:          Physical Exam:    VS:  BP 110/74   Pulse 81   Ht '5\' 9"'$  (1.753 m)   Wt 201 lb 12.8 oz (91.5 kg)   SpO2 96%   BMI 29.80 kg/m    Wt Readings from Last 3 Encounters:  07/04/21 201 lb 12.8 oz (91.5 kg)  07/02/21 200 lb (90.7 kg)  06/18/21 200 lb (90.7 kg)    GENERAL:  No apparent distress, AOx3 HEENT:  No carotid bruits, +2 carotid impulses, no scleral icterus CAR: RRR no murmurs, gallops, rubs, or thrills RES:  Clear to auscultation bilaterally ABD:  Soft, nontender, nondistended, positive bowel sounds x 4 VASC:  +2 radial pulses, +2 carotid pulses, palpable pedal pulses NEURO:  CN 2-12 grossly intact; motor and sensory grossly intact PSYCH:  No active depression or anxiety EXT:  No edema, ecchymosis, or cyanosis  Signed, Early Osmond, MD  07/04/2021 2:[address], [place], [place]  [address] Phone: [phone]; Fax: [phone]   Note:  This document was  prepared using Dragon voice recognition software and may include unintentional dictation errors.

## 2021-07-05 ENCOUNTER — Encounter: Payer: Self-pay | Admitting: Gastroenterology

## 2021-09-18 ENCOUNTER — Ambulatory Visit (INDEPENDENT_AMBULATORY_CARE_PROVIDER_SITE_OTHER): Payer: BC Managed Care – PPO

## 2021-09-18 ENCOUNTER — Other Ambulatory Visit: Payer: Self-pay | Admitting: Cardiology

## 2021-09-18 DIAGNOSIS — E782 Mixed hyperlipidemia: Secondary | ICD-10-CM

## 2021-09-18 DIAGNOSIS — I2510 Atherosclerotic heart disease of native coronary artery without angina pectoris: Secondary | ICD-10-CM

## 2021-09-18 DIAGNOSIS — I447 Left bundle-branch block, unspecified: Secondary | ICD-10-CM | POA: Diagnosis not present

## 2021-09-18 LAB — CUP PACEART REMOTE DEVICE CHECK
Battery Remaining Longevity: 160 mo
Battery Voltage: 3.1 V
Brady Statistic AP VP Percent: 0.02 %
Brady Statistic AP VS Percent: 5.13 %
Brady Statistic AS VP Percent: 0.03 %
Brady Statistic AS VS Percent: 94.82 %
Brady Statistic RA Percent Paced: 5.14 %
Brady Statistic RV Percent Paced: 0.05 %
Date Time Interrogation Session: 20230802065856
Implantable Lead Implant Date: 20220802
Implantable Lead Implant Date: 20220802
Implantable Lead Location: 753859
Implantable Lead Location: 753860
Implantable Lead Model: 3830
Implantable Lead Model: 5076
Implantable Pulse Generator Implant Date: 20220802
Lead Channel Impedance Value: 361 Ohm
Lead Channel Impedance Value: 418 Ohm
Lead Channel Impedance Value: 494 Ohm
Lead Channel Impedance Value: 532 Ohm
Lead Channel Pacing Threshold Amplitude: 0.5 V
Lead Channel Pacing Threshold Amplitude: 1 V
Lead Channel Pacing Threshold Pulse Width: 0.4 ms
Lead Channel Pacing Threshold Pulse Width: 0.4 ms
Lead Channel Sensing Intrinsic Amplitude: 2.875 mV
Lead Channel Sensing Intrinsic Amplitude: 2.875 mV
Lead Channel Sensing Intrinsic Amplitude: 23.5 mV
Lead Channel Sensing Intrinsic Amplitude: 23.5 mV
Lead Channel Setting Pacing Amplitude: 1.5 V
Lead Channel Setting Pacing Amplitude: 2.25 V
Lead Channel Setting Pacing Pulse Width: 0.4 ms
Lead Channel Setting Sensing Sensitivity: 2.8 mV

## 2021-10-11 NOTE — Progress Notes (Signed)
Remote pacemaker transmission.   

## 2021-12-18 ENCOUNTER — Ambulatory Visit (INDEPENDENT_AMBULATORY_CARE_PROVIDER_SITE_OTHER): Payer: BC Managed Care – PPO

## 2021-12-18 DIAGNOSIS — I447 Left bundle-branch block, unspecified: Secondary | ICD-10-CM

## 2021-12-23 LAB — CUP PACEART REMOTE DEVICE CHECK
Battery Remaining Longevity: 159 mo
Battery Voltage: 3.09 V
Brady Statistic AP VP Percent: 0.01 %
Brady Statistic AP VS Percent: 3.73 %
Brady Statistic AS VP Percent: 0.04 %
Brady Statistic AS VS Percent: 96.22 %
Brady Statistic RA Percent Paced: 3.74 %
Brady Statistic RV Percent Paced: 0.05 %
Date Time Interrogation Session: 20231103064400
Implantable Lead Connection Status: 753985
Implantable Lead Connection Status: 753985
Implantable Lead Implant Date: 20220802
Implantable Lead Implant Date: 20220802
Implantable Lead Location: 753859
Implantable Lead Location: 753860
Implantable Lead Model: 3830
Implantable Lead Model: 5076
Implantable Pulse Generator Implant Date: 20220802
Lead Channel Impedance Value: 361 Ohm
Lead Channel Impedance Value: 418 Ohm
Lead Channel Impedance Value: 494 Ohm
Lead Channel Impedance Value: 532 Ohm
Lead Channel Pacing Threshold Amplitude: 0.5 V
Lead Channel Pacing Threshold Amplitude: 1.125 V
Lead Channel Pacing Threshold Pulse Width: 0.4 ms
Lead Channel Pacing Threshold Pulse Width: 0.4 ms
Lead Channel Sensing Intrinsic Amplitude: 2.5 mV
Lead Channel Sensing Intrinsic Amplitude: 2.5 mV
Lead Channel Sensing Intrinsic Amplitude: 24.875 mV
Lead Channel Sensing Intrinsic Amplitude: 24.875 mV
Lead Channel Setting Pacing Amplitude: 1.5 V
Lead Channel Setting Pacing Amplitude: 2.25 V
Lead Channel Setting Pacing Pulse Width: 0.4 ms
Lead Channel Setting Sensing Sensitivity: 2.8 mV
Zone Setting Status: 755011

## 2021-12-31 NOTE — Progress Notes (Signed)
Remote pacemaker transmission.   

## 2022-02-11 NOTE — Progress Notes (Unsigned)
HPI:  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  Last CPE: 02/05/2021  He reports no new problems since his last visit in December of the previous year.  He admits to not exercising regularly lately. He is trying to maintain a healthy diet, cooking at home, and consuming vegetables daily.  He sleeps an average of seven hours.  He denies smoking and excessive alcohol consumption.   Immunization History  Administered Date(s) Administered   Influenza,inj,Quad PF,6+ Mos 12/31/2018, 11/06/2020, 02/12/2022   Td 03/07/2010   Tdap 02/05/2021   Zoster Recombinat (Shingrix) 02/12/2022   Health Maintenance  Topic Date Due   COVID-19 Vaccine (1) 02/28/2022 (Originally 04/19/1971)   Zoster Vaccines- Shingrix (2 of 2) 04/09/2022   DTaP/Tdap/Td (3 - Td or Tdap) 02/06/2031   COLONOSCOPY (Pts 45-80yr Insurance coverage will need to be confirmed)  07/03/2031   INFLUENZA VACCINE  Completed   Hepatitis C Screening  Completed   HIV Screening  Completed   HPV VACCINES  Aged Out   He follows with cardiologist annually. On rosuvastatin 10 mg daily. Lab Results  Component Value Date   CHOL 134 01/01/2021   HDL 46 01/01/2021   LDLCALC 65 01/01/2021   TRIG 127 01/01/2021   CHOLHDL 2.9 01/01/2021   He experiences nocturia, getting up to urinate between one and three times per night, depending on the amount of fluid he drinks. Negative for gross hematuria.  Review of Systems  Constitutional:  Negative for activity change, appetite change and fever.  HENT:  Negative for nosebleeds, sore throat and trouble swallowing.   Eyes:  Negative for redness and visual disturbance.  Respiratory:  Negative for cough, shortness of breath and wheezing.   Cardiovascular:  Negative for chest pain, palpitations and leg swelling.   Gastrointestinal:  Negative for abdominal pain, blood in stool, nausea and vomiting.  Endocrine: Negative for cold intolerance, heat intolerance, polydipsia, polyphagia and polyuria.  Genitourinary:  Negative for decreased urine volume, dysuria, genital sores and testicular pain.  Musculoskeletal:  Positive for arthralgias. Negative for gait problem and joint swelling.  Skin:  Negative for color change and rash.  Allergic/Immunologic: Positive for environmental allergies.  Neurological:  Negative for syncope, weakness and headaches.  Hematological:  Negative for adenopathy. Does not bruise/bleed easily.  Psychiatric/Behavioral:  Negative for confusion and sleep disturbance.    Current Outpatient Medications on File Prior to Visit  Medication Sig Dispense Refill   Acetaminophen (TYLENOL PO) Take 1 tablet by mouth as needed (pain).     aspirin EC 81 MG tablet Take 1 tablet (81 mg total) by mouth daily. Swallow whole. 90 tablet 3   diphenhydrAMINE (BENADRYL) 25 MG tablet Take 25 mg by mouth daily.     ibuprofen (ADVIL,MOTRIN) 800 MG tablet Take 800 mg by mouth every 8 (eight) hours as needed for headache.     loratadine (CLARITIN) 10 MG tablet Take 10 mg by mouth daily as needed for allergies.     rosuvastatin (CRESTOR) 10 MG tablet TAKE 1 TABLET BY MOUTH EVERY DAY 90 tablet 3   No current facility-administered medications on file prior to visit.   Past Medical History:  Diagnosis Date   Allergy    seasonal   ARTHRALGIA 03/07/2010   Qualifier: Diagnosis of  By: HElizebeth KollerMD, TMora Appl   Arthritis    'mild"  Chest tightness 06/27/2020   Chicken pox    Coronary artery disease 09/05/2020   DOE (dyspnea on exertion) 06/27/2020   Headache    Hyperlipidemia    Left bundle branch block    Left bundle branch block (LBBB) 12/29/2016   Mixed dyslipidemia 10/28/2019   Non-traumatic mid back pain 04/24/2016   Nonsustained ventricular tachycardia ([place]) 09/05/2020   Syncope 11/28/2016    Syncope and collapse 09/17/2020   Third degree heart block [facility])    Past Surgical History:  Procedure Laterality Date   PACEMAKER IMPLANT N/A 09/18/2020   Procedure: PACEMAKER IMPLANT;  Surgeon: Thompson Grayer, MD;  Location: [facility];  Service: Cardiovascular;  Laterality: N/A;   WISDOM TOOTH EXTRACTION     WRIST SURGERY  02/17/2001   broken hand/wrist   Allergies  Allergen Reactions   Cefdinir Rash    Rash groin Mood   Family History  Problem Relation Age of Onset   Colon polyps Mother    Hyperlipidemia Mother    Diabetes Mother    Arthritis Father    Cancer Father 104       hodgkins   Stroke Maternal Grandmother    Arthritis Paternal Grandmother    Hypertension Paternal Grandmother    Heart disease Paternal Grandmother    Colon cancer Neg Hx    Crohn's disease Neg Hx    Esophageal cancer Neg Hx    Rectal cancer Neg Hx    Stomach cancer Neg Hx    Social History   Socioeconomic History   Marital status: Married    Spouse name: Not on file   Number of children: 1   Years of education: PhD   Highest education level: Not on file  Occupational History   Occupation: professor of Doctor, hospital: [employer]  Tobacco Use   Smoking status: Never   Smokeless tobacco: Never  Vaping Use   Vaping Use: Never used  Substance and Sexual Activity   Alcohol use: Yes    Comment: 3 - 4 times per week    Drug use: No   Sexual activity: Not on file  Other Topics Concern   Not on file  Social History Narrative   Lives with wife and child in a one story home.  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  Education: Phd   Social Determinants of Radio broadcast assistant Strain: Not on file  Food Insecurity: Not on file  Transportation Needs: Not on file  Physical Activity: Not on file  Stress: Not on file  Social Connections: Not on file   Vitals:   02/12/22 0746  BP: 110/76  Pulse: 85  Resp: 12  Temp: 98.1 F (36.7 C)  SpO2: 92%  Body mass index  is 31.31 kg/m. Wt Readings from Last 3 Encounters:  02/12/22 212 lb (96.2 kg)  07/04/21 201 lb 12.8 oz (91.5 kg)  07/02/21 200 lb (90.7 kg)   Physical Exam Vitals and nursing note reviewed.  Constitutional:      General: He is not in acute distress.    Appearance: He is well-developed.  HENT:     Head: Normocephalic and atraumatic.     Right Ear: Tympanic membrane, ear canal and external ear normal.     Left Ear: Tympanic membrane, ear canal and external ear normal.     Mouth/Throat:     Mouth: Mucous membranes are moist.     Pharynx: Oropharynx is clear.  Eyes:  Extraocular Movements: Extraocular movements intact.     Conjunctiva/sclera: Conjunctivae normal.     Pupils: Pupils are equal, round, and reactive to light.     Comments: Xanthelasma lower > upper eye lids bilateral.  Neck:     Thyroid: No thyromegaly.     Trachea: No tracheal deviation.  Cardiovascular:     Rate and Rhythm: Normal rate and regular rhythm.     Pulses:          Dorsalis pedis pulses are 2+ on the right side and 2+ on the left side.     Heart sounds: No murmur heard. Pulmonary:     Effort: Pulmonary effort is normal. No respiratory distress.     Breath sounds: Normal breath sounds.  Abdominal:     Palpations: Abdomen is soft. There is no hepatomegaly or mass.     Tenderness: There is no abdominal tenderness.  Genitourinary:    Comments: No concerns. Musculoskeletal:        General: No tenderness.     Cervical back: Normal range of motion.     Comments: No major deformities appreciated and no signs of synovitis.  Lymphadenopathy:     Cervical: No cervical adenopathy.     Upper Body:     Right upper body: No supraclavicular adenopathy.     Left upper body: No supraclavicular adenopathy.  Skin:    General: Skin is warm.     Findings: No erythema.  Neurological:     General: No focal deficit present.     Mental Status: He is alert and oriented to person, place, and time.     Cranial  Nerves: No cranial nerve deficit.     Sensory: No sensory deficit.     Gait: Gait normal.     Deep Tendon Reflexes:     Reflex Scores:      Bicep reflexes are 2+ on the right side and 2+ on the left side.      Patellar reflexes are 2+ on the right side and 2+ on the left side. Psychiatric:        Mood and Affect: Mood and affect normal.   ASSESSMENT AND PLAN:  Mr.Anthony was seen today for annual exam.  Diagnoses and all orders for this visit:  Orders Placed This Encounter  Procedures   Flu Vaccine QUAD 44moIM (Fluarix, Fluzone & Alfiuria Quad PF)   Varicella-zoster vaccine IM   Comprehensive metabolic panel   Hemoglobin A1c   Lipid panel   PSA  No results found for: "PSA1", "PSA" Lab Results  Component Value Date   CHOL 134 01/01/2021   HDL 46 01/01/2021   LDLCALC 65 01/01/2021   TRIG 127 01/01/2021   CHOLHDL 2.9 01/01/2021   Lab Results  Component Value Date   HGBA1C 5.6 02/05/2021   Lab Results  Component Value Date   CREATININE 0.99 10/24/2020   BUN 10 10/24/2020   NA 139 10/24/2020   K 4.3 10/24/2020   CL 102 10/24/2020   CO2 23 10/24/2020   Lab Results  Component Value Date   ALT 22 01/01/2021   AST 21 01/01/2021   ALKPHOS 77 01/01/2021   BILITOT 0.7 01/01/2021   Routine general medical examination at a health care facility Assessment & Plan: We discussed the importance of regular physical activity and healthy diet for prevention of chronic illness and/or complications. Preventive guidelines reviewed. Vaccination updated. Next CPE in a year.   Screening for endocrine, metabolic and immunity disorder -  Comprehensive metabolic panel; Future -     Hemoglobin A1c; Future  Mixed dyslipidemia Assessment & Plan: Continue rosuvastatin 10 mg daily and low-fat diet. Further recommendation will be given according to lipid panel result.  Orders: -     Comprehensive metabolic panel; Future -     Lipid panel; Future  Need for influenza  vaccination -     Flu Vaccine QUAD 22moIM (Fluarix, Fluzone & Alfiuria Quad PF)  Prostate cancer screening -     PSA; Future  Need for shingles vaccine -     Varicella-zoster vaccine IM  Transient complete heart block (HCC)  S/P pacemaker placements. Follows with cardiologist annually.  Return in 1 year (on 02/13/2023) for CPE.  Donta Fuster G. JMartinique MD  [facility] [place].

## 2022-02-12 ENCOUNTER — Encounter: Payer: Self-pay | Admitting: Family Medicine

## 2022-02-12 ENCOUNTER — Ambulatory Visit (INDEPENDENT_AMBULATORY_CARE_PROVIDER_SITE_OTHER): Payer: BC Managed Care – PPO | Admitting: Family Medicine

## 2022-02-12 VITALS — BP 110/76 | HR 85 | Temp 98.1°F | Resp 12 | Ht 69.0 in | Wt 212.0 lb

## 2022-02-12 DIAGNOSIS — Z23 Encounter for immunization: Secondary | ICD-10-CM | POA: Diagnosis not present

## 2022-02-12 DIAGNOSIS — Z130 Encounter for screening for diseases of the blood and blood-forming organs and certain disorders involving the immune mechanism: Secondary | ICD-10-CM | POA: Diagnosis not present

## 2022-02-12 DIAGNOSIS — Z1329 Encounter for screening for other suspected endocrine disorder: Secondary | ICD-10-CM | POA: Diagnosis not present

## 2022-02-12 DIAGNOSIS — Z13228 Encounter for screening for other metabolic disorders: Secondary | ICD-10-CM | POA: Diagnosis not present

## 2022-02-12 DIAGNOSIS — E782 Mixed hyperlipidemia: Secondary | ICD-10-CM

## 2022-02-12 DIAGNOSIS — I442 Atrioventricular block, complete: Secondary | ICD-10-CM

## 2022-02-12 DIAGNOSIS — Z0000 Encounter for general adult medical examination without abnormal findings: Secondary | ICD-10-CM | POA: Diagnosis not present

## 2022-02-12 DIAGNOSIS — Z125 Encounter for screening for malignant neoplasm of prostate: Secondary | ICD-10-CM | POA: Diagnosis not present

## 2022-02-12 LAB — COMPREHENSIVE METABOLIC PANEL
ALT: 24 U/L (ref 0–53)
AST: 19 U/L (ref 0–37)
Albumin: 4.5 g/dL (ref 3.5–5.2)
Alkaline Phosphatase: 51 U/L (ref 39–117)
BUN: 15 mg/dL (ref 6–23)
CO2: 31 mEq/L (ref 19–32)
Calcium: 9.7 mg/dL (ref 8.4–10.5)
Chloride: 103 mEq/L (ref 96–112)
Creatinine, Ser: 1.02 mg/dL (ref 0.40–1.50)
GFR: 85.82 mL/min (ref >60.00)
Glucose, Bld: 69 mg/dL — ABNORMAL LOW (ref 70–99)
Potassium: 4.3 mEq/L (ref 3.5–5.1)
Sodium: 140 mEq/L (ref 135–145)
Total Bilirubin: 0.6 mg/dL (ref 0.2–1.2)
Total Protein: 7.2 g/dL (ref 6.0–8.3)

## 2022-02-12 LAB — LIPID PANEL
Cholesterol: 142 mg/dL (ref 0–200)
HDL: 59 mg/dL (ref >39.00)
LDL Cholesterol: 46 mg/dL (ref 0–99)
NonHDL: 83.42
Total CHOL/HDL Ratio: 2
Triglycerides: 186 mg/dL — ABNORMAL HIGH (ref 0.0–149.0)
VLDL: 37.2 mg/dL (ref 0.0–40.0)

## 2022-02-12 LAB — PSA: PSA: 0.35 ng/mL (ref 0.10–4.00)

## 2022-02-12 LAB — HEMOGLOBIN A1C: Hgb A1c MFr Bld: 5.6 % (ref 4.6–6.5)

## 2022-02-12 NOTE — Assessment & Plan Note (Signed)
Continue rosuvastatin 10 mg daily and low-fat diet. Further recommendation will be given according to lipid panel result.

## 2022-02-12 NOTE — Assessment & Plan Note (Signed)
We discussed the importance of regular physical activity and healthy diet for prevention of chronic illness and/or complications. Preventive guidelines reviewed. Vaccination updated. Next CPE in a year. 

## 2022-02-12 NOTE — Patient Instructions (Addendum)
A few things to remember from today's visit:  Routine general medical examination at a health care facility  Screening for endocrine, metabolic and immunity disorder - Plan: Comprehensive metabolic panel, Hemoglobin A1c  Mixed dyslipidemia - Plan: Comprehensive metabolic panel, Lipid panel  Need for influenza vaccination  Prostate cancer screening - Plan: PSA  If you need refills for medications you take chronically, please call your pharmacy. Do not use My Chart to request refills or for acute issues that need immediate attention. If you send a my chart message, it may take a few days to be addressed, specially if I am not in the office.  Please be sure medication list is accurate. If a new problem present, please set up appointment sooner than planned today.  Health Maintenance, Male Adopting a healthy lifestyle and getting preventive care are important in promoting health and wellness. Ask your health care provider about: The right schedule for you to have regular tests and exams. Things you can do on your own to prevent diseases and keep yourself healthy. What should I know about diet, weight, and exercise? Eat a healthy diet  Eat a diet that includes plenty of vegetables, fruits, low-fat dairy products, and lean protein. Do not eat a lot of foods that are high in solid fats, added sugars, or sodium. Maintain a healthy weight Body mass index (BMI) is a measurement that can be used to identify possible weight problems. It estimates body fat based on height and weight. Your health care provider can help determine your BMI and help you achieve or maintain a healthy weight. Get regular exercise Get regular exercise. This is one of the most important things you can do for your health. Most adults should: Exercise for at least 150 minutes each week. The exercise should increase your heart rate and make you sweat (moderate-intensity exercise). Do strengthening exercises at least twice a  week. This is in addition to the moderate-intensity exercise. Spend less time sitting. Even light physical activity can be beneficial. Watch cholesterol and blood lipids Have your blood tested for lipids and cholesterol at 50 years of age, then have this test every 5 years. You may need to have your cholesterol levels checked more often if: Your lipid or cholesterol levels are high. You are older than 50 years of age. You are at high risk for heart disease. What should I know about cancer screening? Many types of cancers can be detected early and may often be prevented. Depending on your health history and family history, you may need to have cancer screening at various ages. This may include screening for: Colorectal cancer. Prostate cancer. Skin cancer. Lung cancer. What should I know about heart disease, diabetes, and high blood pressure? Blood pressure and heart disease High blood pressure causes heart disease and increases the risk of stroke. This is more likely to develop in people who have high blood pressure readings or are overweight. Talk with your health care provider about your target blood pressure readings. Have your blood pressure checked: Every 3-5 years if you are 45-53 years of age. Every year if you are 95 years old or older. If you are between the ages of 23 and 49 and are a current or former smoker, ask your health care provider if you should have a one-time screening for abdominal aortic aneurysm (AAA). Diabetes Have regular diabetes screenings. This checks your fasting blood sugar level. Have the screening done: Once every three years after age 14 if you are at  a normal weight and have a low risk for diabetes. More often and at a younger age if you are overweight or have a high risk for diabetes. What should I know about preventing infection? Hepatitis B If you have a higher risk for hepatitis B, you should be screened for this virus. Talk with your health care  provider to find out if you are at risk for hepatitis B infection. Hepatitis C Blood testing is recommended for: Everyone born from [birth year] through 1964. Anyone with known risk factors for hepatitis C. Sexually transmitted infections (STIs) You should be screened each year for STIs, including gonorrhea and chlamydia, if: You are sexually active and are younger than 50 years of age. You are older than 50 years of age and your health care provider tells you that you are at risk for this type of infection. Your sexual activity has changed since you were last screened, and you are at increased risk for chlamydia or gonorrhea. Ask your health care provider if you are at risk. Ask your health care provider about whether you are at high risk for HIV. Your health care provider may recommend a prescription medicine to help prevent HIV infection. If you choose to take medicine to prevent HIV, you should first get tested for HIV. You should then be tested every 3 months for as long as you are taking the medicine. Follow these instructions at home: Alcohol use Do not drink alcohol if your health care provider tells you not to drink. If you drink alcohol: Limit how much you have to 0-2 drinks a day. Know how much alcohol is in your drink. In the [place] one drink equals one 12 oz bottle of beer (355 mL), one 5 oz glass of wine (148 mL), or one 1 oz glass of hard liquor (44 mL). Lifestyle Do not use any products that contain nicotine or tobacco. These products include cigarettes, chewing tobacco, and vaping devices, such as e-cigarettes. If you need help quitting, ask your health care provider. Do not use street drugs. Do not share needles. Ask your health care provider for help if you need support or information about quitting drugs. General instructions Schedule regular health, dental, and eye exams. Stay current with your vaccines. Tell your health care provider if: You often feel depressed. You have  ever been abused or do not feel safe at home. Summary Adopting a healthy lifestyle and getting preventive care are important in promoting health and wellness. Follow your health care provider's instructions about healthy diet, exercising, and getting tested or screened for diseases. Follow your health care provider's instructions on monitoring your cholesterol and blood pressure. This information is not intended to replace advice given to you by your health care provider. Make sure you discuss any questions you have with your health care provider. Document Revised: 06/25/2020 Document Reviewed: 06/25/2020 Elsevier Patient Education  [place].

## 2022-02-18 NOTE — Progress Notes (Addendum)
Electrophysiology Office Note Date: 02/25/2022  ID:  Joel Lynch, DOB 06-03-1970, MRN [id number]  PCP: Martinique, [name], MD Primary Cardiologist: Early Osmond, MD Electrophysiologist: Dr. Rayann Heman -> [name], MD   CC: Pacemaker follow-up  Joel Lynch is a 52 y.o. male seen today for [name], MD for routine electrophysiology followup. Since last being seen in our clinic the patient reports doing well from a cardiac perspective. He has rare palpitations.  he denies chest pain, dyspnea, PND, orthopnea, nausea, vomiting, dizziness, syncope, edema, weight gain, or early satiety.   Device History: Medtronic Dual Chamber PPM implanted 09/2020 for transient CHB / Syncope  Past Medical History:  Diagnosis Date   Allergy    seasonal   ARTHRALGIA 03/07/2010   Qualifier: Diagnosis of  By: Elizebeth Koller MD, Mora Appl    Arthritis    'mild"   Chest tightness 06/27/2020   Chicken pox    Coronary artery disease 09/05/2020   DOE (dyspnea on exertion) 06/27/2020   Headache    Hyperlipidemia    Left bundle branch block    Left bundle branch block (LBBB) 12/29/2016   Mixed dyslipidemia 10/28/2019   Non-traumatic mid back pain 04/24/2016   Nonsustained ventricular tachycardia ([place]) 09/05/2020   Syncope 11/28/2016   Syncope and collapse 09/17/2020   Third degree heart block ([place])    Past Surgical History:  Procedure Laterality Date   PACEMAKER IMPLANT N/A 09/18/2020   Procedure: PACEMAKER IMPLANT;  Surgeon: Thompson Grayer, MD;  Location: [facility];  Service: Cardiovascular;  Laterality: N/A;   WISDOM TOOTH EXTRACTION     WRIST SURGERY  02/17/2001   broken hand/wrist    Current Outpatient Medications  Medication Sig Dispense Refill   Acetaminophen (TYLENOL PO) Take 1 tablet by mouth as needed (pain).     aspirin EC 81 MG tablet Take 1 tablet (81 mg total) by mouth daily. Swallow whole. 90 tablet 3   diphenhydrAMINE (BENADRYL) 25 MG tablet Take 25 mg by mouth  daily.     ibuprofen (ADVIL,MOTRIN) 800 MG tablet Take 800 mg by mouth every 8 (eight) hours as needed for headache.     loratadine (CLARITIN) 10 MG tablet Take 10 mg by mouth daily as needed for allergies.     rosuvastatin (CRESTOR) 10 MG tablet Take 1 tablet (10 mg total) by mouth daily. 90 tablet 3   No current facility-administered medications for this visit.    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  Education: Phd   Social Determinants of Radio broadcast assistant Strain: Not on file  Food Insecurity: Not on file  Transportation Needs:  Not on file  Physical Activity: Not on file  Stress: Not on file  Social Connections: Not on file  Intimate Partner Violence: Not on file    Family History: Family History  Problem Relation Age of Onset   Colon polyps Mother    Hyperlipidemia Mother    Diabetes Mother    Arthritis Father    Cancer Father 8       hodgkins   Stroke Maternal Grandmother    Arthritis Paternal Grandmother    Hypertension Paternal Grandmother    Heart disease Paternal Grandmother    Colon cancer Neg Hx    Crohn's disease Neg Hx    Esophageal cancer Neg Hx    Rectal cancer Neg Hx    Stomach cancer Neg Hx      Review of Systems: All other systems reviewed and are otherwise negative except as  noted above.  Physical Exam: Vitals:   02/25/22 0853  BP: 110/74  Pulse: 84  SpO2: 93%  Weight: 209 lb 12.8 oz (95.2 kg)  Height: '5\' 9"'$  (1.753 m)     GEN- The patient is well appearing, alert and oriented x 3 today.   HEENT: normocephalic, atraumatic; sclera clear, conjunctiva pink; hearing intact; oropharynx clear; neck supple, no JVP Lymph- no cervical lymphadenopathy Lungs- Clear to ausculation bilaterally, normal work of breathing.  No wheezes, rales, rhonchi Heart- Regular  rate and rhythm, no murmurs, rubs or gallops, PMI not laterally displaced GI- soft, non-tender, non-distended, bowel sounds present, no hepatosplenomegaly Extremities- no clubbing or cyanosis. No peripheral edema; DP/PT/radial pulses 2+ bilaterally MS- no significant deformity or atrophy Skin- warm and dry, no rash or lesion; PPM pocket well healed Psych- euthymic mood, full affect Neuro- strength and sensation are intact  PPM Interrogation-  reviewed in detail today,  See PACEART report.  EKG:  EKG is ordered today. Personal review of ekg ordered today shows NSR at 76 bpm with Left bundle at baseline  Recent Labs: 02/12/2022: ALT 24; BUN 15; Creatinine, Ser 1.02; Potassium 4.3; Sodium 140   Wt Readings from Last 3 Encounters:  02/25/22 209 lb 12.8 oz (95.2 kg)  02/12/22 212 lb (96.2 kg)  07/04/21 201 lb 12.8 oz (91.5 kg)     Other studies Reviewed: Additional studies/ records that were reviewed today include: Previous EP office notes, Previous remote checks, Most recent labwork.   Assessment and Plan:  1. CHB and sycnope s/p Medtronic PPM  Normal PPM function See Pace Art report No changes today  2. CAD Denies s/s ischemia  Current medicines are reviewed at length with the patient today.    Labs/ tests ordered today include:  Orders Placed This Encounter  Procedures   EKG 12-Lead   Disposition:   Follow up with Dr. Myles Gip in 12 months    Signed, Shirley Friar, PA-C   02/25/2022 9:26 AM  [facility] [address] [place] [address] [phone] (office) [phone] (fax)

## 2022-02-25 ENCOUNTER — Ambulatory Visit: Payer: BC Managed Care – PPO | Attending: Student | Admitting: Student

## 2022-02-25 ENCOUNTER — Encounter: Payer: Self-pay | Admitting: Student

## 2022-02-25 VITALS — BP 110/74 | HR 84 | Ht 69.0 in | Wt 209.8 lb

## 2022-02-25 DIAGNOSIS — I442 Atrioventricular block, complete: Secondary | ICD-10-CM

## 2022-02-25 DIAGNOSIS — E782 Mixed hyperlipidemia: Secondary | ICD-10-CM | POA: Diagnosis not present

## 2022-02-25 DIAGNOSIS — I2510 Atherosclerotic heart disease of native coronary artery without angina pectoris: Secondary | ICD-10-CM | POA: Diagnosis not present

## 2022-02-25 LAB — CUP PACEART INCLINIC DEVICE CHECK
Battery Remaining Longevity: 159 mo
Battery Voltage: 3.08 V
Brady Statistic AP VP Percent: 0.01 %
Brady Statistic AP VS Percent: 4.45 %
Brady Statistic AS VP Percent: 0.03 %
Brady Statistic AS VS Percent: 95.5 %
Brady Statistic RA Percent Paced: 4.47 %
Brady Statistic RV Percent Paced: 0.05 %
Date Time Interrogation Session: 20240109092735
Implantable Lead Connection Status: 753985
Implantable Lead Connection Status: 753985
Implantable Lead Implant Date: 20220802
Implantable Lead Implant Date: 20220802
Implantable Lead Location: 753859
Implantable Lead Location: 753860
Implantable Lead Model: 3830
Implantable Lead Model: 5076
Implantable Pulse Generator Implant Date: 20220802
Lead Channel Impedance Value: 380 Ohm
Lead Channel Impedance Value: 418 Ohm
Lead Channel Impedance Value: 494 Ohm
Lead Channel Impedance Value: 551 Ohm
Lead Channel Pacing Threshold Amplitude: 0.5 V
Lead Channel Pacing Threshold Amplitude: 1.125 V
Lead Channel Pacing Threshold Pulse Width: 0.4 ms
Lead Channel Pacing Threshold Pulse Width: 0.4 ms
Lead Channel Sensing Intrinsic Amplitude: 2.625 mV
Lead Channel Sensing Intrinsic Amplitude: 22.375 mV
Lead Channel Sensing Intrinsic Amplitude: 25.75 mV
Lead Channel Sensing Intrinsic Amplitude: 3.125 mV
Lead Channel Setting Pacing Amplitude: 1.5 V
Lead Channel Setting Pacing Amplitude: 2.25 V
Lead Channel Setting Pacing Pulse Width: 0.4 ms
Lead Channel Setting Sensing Sensitivity: 2.8 mV
Zone Setting Status: 755011

## 2022-02-25 MED ORDER — ROSUVASTATIN CALCIUM 10 MG PO TABS: 10.0000 mg | ORAL_TABLET | Freq: Every day | ORAL | 3 refills | Status: DC

## 2022-02-25 NOTE — Patient Instructions (Signed)
Medication Instructions:  Your physician recommends that you continue on your current medications as directed. Please refer to the Current Medication list given to you today.  *If you need a refill on your cardiac medications before your next appointment, please call your pharmacy*   Lab Work: None If you have labs (blood work) drawn today and your tests are completely normal, you will receive your results only by: [place] (if you have MyChart) OR A paper copy in the mail If you have any lab test that is abnormal or we need to change your treatment, we will call you to review the results.   Follow-Up: At [facility], you and your health needs are our priority.  As part of our continuing mission to provide you with exceptional heart care, we have created designated Provider Care Teams.  These Care Teams include your primary Cardiologist (physician) and Advanced Practice Providers (APPs -  Physician Assistants and Nurse Practitioners) who all work together to provide you with the care you need, when you need it.   Your next appointment:   1 year(s)  The format for your next appointment:   In Person  Provider:   Doralee Albino, MD     Important Information About Sugar

## 2022-03-19 ENCOUNTER — Ambulatory Visit (INDEPENDENT_AMBULATORY_CARE_PROVIDER_SITE_OTHER): Payer: BC Managed Care – PPO

## 2022-03-19 DIAGNOSIS — I442 Atrioventricular block, complete: Secondary | ICD-10-CM | POA: Diagnosis not present

## 2022-03-21 LAB — CUP PACEART REMOTE DEVICE CHECK
Battery Remaining Longevity: 157 mo
Battery Voltage: 3.08 V
Brady Statistic AP VP Percent: 0.01 %
Brady Statistic AP VS Percent: 2.72 %
Brady Statistic AS VP Percent: 0.03 %
Brady Statistic AS VS Percent: 97.24 %
Brady Statistic RA Percent Paced: 2.72 %
Brady Statistic RV Percent Paced: 0.04 %
Date Time Interrogation Session: 20240201170812
Implantable Lead Connection Status: 753985
Implantable Lead Connection Status: 753985
Implantable Lead Implant Date: 20220802
Implantable Lead Implant Date: 20220802
Implantable Lead Location: 753859
Implantable Lead Location: 753860
Implantable Lead Model: 3830
Implantable Lead Model: 5076
Implantable Pulse Generator Implant Date: 20220802
Lead Channel Impedance Value: 380 Ohm
Lead Channel Impedance Value: 418 Ohm
Lead Channel Impedance Value: 494 Ohm
Lead Channel Impedance Value: 532 Ohm
Lead Channel Pacing Threshold Amplitude: 0.5 V
Lead Channel Pacing Threshold Amplitude: 1.125 V
Lead Channel Pacing Threshold Pulse Width: 0.4 ms
Lead Channel Pacing Threshold Pulse Width: 0.4 ms
Lead Channel Sensing Intrinsic Amplitude: 2.25 mV
Lead Channel Sensing Intrinsic Amplitude: 2.25 mV
Lead Channel Sensing Intrinsic Amplitude: 24 mV
Lead Channel Sensing Intrinsic Amplitude: 24 mV
Lead Channel Setting Pacing Amplitude: 1.5 V
Lead Channel Setting Pacing Amplitude: 2.5 V
Lead Channel Setting Pacing Pulse Width: 0.4 ms
Lead Channel Setting Sensing Sensitivity: 2.8 mV
Zone Setting Status: 755011

## 2022-04-10 NOTE — Progress Notes (Signed)
Remote pacemaker transmission.   

## 2022-05-13 ENCOUNTER — Other Ambulatory Visit: Payer: Self-pay

## 2022-05-13 DIAGNOSIS — I2510 Atherosclerotic heart disease of native coronary artery without angina pectoris: Secondary | ICD-10-CM

## 2022-05-13 DIAGNOSIS — E782 Mixed hyperlipidemia: Secondary | ICD-10-CM

## 2022-05-13 MED ORDER — ROSUVASTATIN CALCIUM 10 MG PO TABS: 10.0000 mg | ORAL_TABLET | Freq: Every day | ORAL | 3 refills | Status: DC

## 2022-05-15 ENCOUNTER — Ambulatory Visit (INDEPENDENT_AMBULATORY_CARE_PROVIDER_SITE_OTHER): Payer: BC Managed Care – PPO | Admitting: *Deleted

## 2022-05-15 DIAGNOSIS — Z23 Encounter for immunization: Secondary | ICD-10-CM | POA: Diagnosis not present

## 2022-06-18 ENCOUNTER — Ambulatory Visit (INDEPENDENT_AMBULATORY_CARE_PROVIDER_SITE_OTHER): Payer: BC Managed Care – PPO

## 2022-06-18 DIAGNOSIS — I447 Left bundle-branch block, unspecified: Secondary | ICD-10-CM | POA: Diagnosis not present

## 2022-06-18 LAB — CUP PACEART REMOTE DEVICE CHECK
Battery Remaining Longevity: 155 mo
Battery Voltage: 3.07 V
Brady Statistic AP VP Percent: 0.01 %
Brady Statistic AP VS Percent: 3.43 %
Brady Statistic AS VP Percent: 0.03 %
Brady Statistic AS VS Percent: 96.53 %
Brady Statistic RA Percent Paced: 3.44 %
Brady Statistic RV Percent Paced: 0.04 %
Date Time Interrogation Session: 20240430220615
Implantable Lead Connection Status: 753985
Implantable Lead Connection Status: 753985
Implantable Lead Implant Date: 20220802
Implantable Lead Implant Date: 20220802
Implantable Lead Location: 753859
Implantable Lead Location: 753860
Implantable Lead Model: 3830
Implantable Lead Model: 5076
Implantable Pulse Generator Implant Date: 20220802
Lead Channel Impedance Value: 361 Ohm
Lead Channel Impedance Value: 399 Ohm
Lead Channel Impedance Value: 475 Ohm
Lead Channel Impedance Value: 532 Ohm
Lead Channel Pacing Threshold Amplitude: 0.5 V
Lead Channel Pacing Threshold Amplitude: 1.125 V
Lead Channel Pacing Threshold Pulse Width: 0.4 ms
Lead Channel Pacing Threshold Pulse Width: 0.4 ms
Lead Channel Sensing Intrinsic Amplitude: 2.625 mV
Lead Channel Sensing Intrinsic Amplitude: 2.625 mV
Lead Channel Sensing Intrinsic Amplitude: 21.75 mV
Lead Channel Sensing Intrinsic Amplitude: 21.75 mV
Lead Channel Setting Pacing Amplitude: 1.5 V
Lead Channel Setting Pacing Amplitude: 2.5 V
Lead Channel Setting Pacing Pulse Width: 0.4 ms
Lead Channel Setting Sensing Sensitivity: 2.8 mV
Zone Setting Status: 755011

## 2022-07-08 ENCOUNTER — Ambulatory Visit: Payer: BC Managed Care – PPO | Admitting: Physician Assistant

## 2022-07-09 NOTE — Progress Notes (Signed)
Remote pacemaker transmission.   

## 2022-08-04 NOTE — Progress Notes (Unsigned)
Office Visit    Patient Name: Joel Lynch Date of Encounter: 08/04/2022  Primary Care Provider:  [place], [name], MD Primary Cardiologist:  Orbie Pyo, MD Primary Electrophysiologist: Maurice Small, MD   Past Medical History    Past Medical History:  Diagnosis Date   Allergy    seasonal   ARTHRALGIA 03/07/2010   Qualifier: Diagnosis of  By: Rodena Medin MD, Acie Fredrickson    Arthritis    'mild"   Chest tightness 06/27/2020   Chicken pox    Coronary artery disease 09/05/2020   DOE (dyspnea on exertion) 06/27/2020   Headache    Hyperlipidemia    Left bundle branch block    Left bundle branch block (LBBB) 12/29/2016   Mixed dyslipidemia 10/28/2019   Non-traumatic mid back pain 04/24/2016   Nonsustained ventricular tachycardia (HCC) 09/05/2020   Syncope 11/28/2016   Syncope and collapse 09/17/2020   Third degree heart block [facility])    Past Surgical History:  Procedure Laterality Date   PACEMAKER IMPLANT N/A 09/18/2020   Procedure: PACEMAKER IMPLANT;  Surgeon: Hillis Range, MD;  Location: [facility];  Service: Cardiovascular;  Laterality: N/A;   WISDOM TOOTH EXTRACTION     WRIST SURGERY  02/17/2001   broken hand/wrist    Allergies  Allergies  Allergen Reactions   Cefdinir Rash    Rash groin Mood     History of Present Illness    Joel Lynch  is a 52 year old male with a PMH of nonobstructive CAD, LBBB, NSVT, HLD, syncope and collapse, 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  Mr. Runser was seen initially in 2018 by Dr. Tomie China following a syncopal episode.  2D echo was completed showing EF of 50-55% with no RWMA and trivial AVR.  He completed a Lexiscan Myoview that showed no evidence of ischemia and was low risk.  He wore Holter monitor also that showed 4 beats of nonsustained VT but no other abnormalities.  He completed a coronary calcium score 07/2020 that showed calcium score 17 and mild nonobstructive CAD.  He  presented to the ED on 09/17/2020 due to unwitnessed syncopal episode.  Patient was wearing 30-day [employer] monitor at the time and was found to have complete heart block.  He underwent PPM placement on 09/2020 with Medtronic PPM.  He tolerated procedure well.  He establish care with Dr.  Lynnette Caffey later that year and was last seen 07/04/2021 for follow-up.  During visit patient was doing well with blood pressure controlled and device functioning properly.  Mr. Harvin presents today for 1 year follow-up alone.  Since last being seen in the office patient reports that he is doing well with no new cardiac complaints since his previous visit.  His blood pressure today is well-controlled at 106/70 and heart rate is 83 bpm.  He reports compliance with his current medications and denies any adverse reactions.  He reports feeling much better with his new PDM and has more focus but does note putting on more weight due to snacking on chips and pretzels.  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  During today's visit we discussed the importance of maintaining good worklife balance and increasing physical activity.  He does have hobbies such as fishing and bowling but is interested in possibly participating in exercises around the home.  Patient denies chest pain, palpitations, dyspnea, PND, orthopnea, nausea, vomiting, dizziness,  syncope, edema, weight gain, or early satiety.  Home Medications    Current Outpatient Medications  Medication Sig Dispense Refill   Acetaminophen (TYLENOL PO) Take 1 tablet by mouth as needed (pain).     aspirin EC 81 MG tablet Take 1 tablet (81 mg total) by mouth daily. Swallow whole. 90 tablet 3   diphenhydrAMINE (BENADRYL) 25 MG tablet Take 25 mg by mouth daily.     ibuprofen (ADVIL,MOTRIN) 800 MG tablet Take 800 mg by mouth every 8 (eight) hours as needed for headache.     loratadine (CLARITIN) 10 MG tablet Take 10 mg by mouth daily as  needed for allergies.     rosuvastatin (CRESTOR) 10 MG tablet Take 1 tablet (10 mg total) by mouth daily. 90 tablet 3   No current facility-administered medications for this visit.     Review of Systems  Please see the history of present illness.    (+) Anxiety  All other systems reviewed and are otherwise negative except as noted above.  Physical Exam    Wt Readings from Last 3 Encounters:  02/25/22 209 lb 12.8 oz (95.2 kg)  02/12/22 212 lb (96.2 kg)  07/04/21 201 lb 12.8 oz (91.5 kg)   ZO:XWRUE were no vitals filed for this visit.,There is no height or weight on file to calculate BMI.  Constitutional:      Appearance: Healthy appearance. Not in distress.  Neck:     Vascular: JVD normal.  Pulmonary:     Effort: Pulmonary effort is normal.     Breath sounds: No wheezing. No rales. Diminished in the bases Cardiovascular:     Normal rate. Regular rhythm. Normal S1. Normal S2.      Murmurs: There is no murmur.  Edema:    Peripheral edema absent.  Abdominal:     Palpations: Abdomen is soft non tender. There is no hepatomegaly.  Skin:    General: Skin is warm and dry.  Neurological:     General: No focal deficit present.     Mental Status: Alert and oriented to person, place and time.     Cranial Nerves: Cranial nerves are intact.  EKG/LABS/ Recent Cardiac Studies    ECG personally reviewed by me today -none completed today  Cardiac Studies & Procedures     STRESS TESTS  MYOCARDIAL PERFUSION IMAGING 12/12/2016  Narrative  Nuclear stress EF: 53%. With no wall motion abnormalities  There was no ST segment deviation noted during stress.  Defect 1: There is a small defect of moderate severity present in the mid anteroseptal location consistent with left bundle branch block baseline artifact.  This is a low risk study. No significant ischemia identified. Left bundle branch block at baseline  Donato Schultz, MD   ECHOCARDIOGRAM  ECHOCARDIOGRAM COMPLETE  09/03/2020  Narrative ECHOCARDIOGRAM REPORT    Patient Name:   Joel Lynch [facility] Date of Exam: 09/03/2020 Medical Rec #:  [id number]       Height:       69.0 in Accession #:    [id number]      Weight:       213.2 lb Date of Birth:  [birth year]        BSA:          2.123 m Patient Age:    49 years        BP:           106/74 mmHg Patient Gender: M  HR:           65 bpm. Exam Location:  [place]  Procedure: 2D Echo  Indications:    Septal defect, heart [Q21.9]  History:        Patient has no prior history of Echocardiogram examinations. Arrythmias:LBBB; Signs/Symptoms:Syncope and Dyspnea.  Sonographer:    Louie Boston Referring Phys: Rito Ehrlich [facility]  IMPRESSIONS   1. Left ventricular ejection fraction, by estimation, is 60 to 65%. The left ventricle has normal function. The left ventricle has no regional wall motion abnormalities. Left ventricular diastolic parameters were normal. 2. The mitral valve is normal in structure. Mild mitral valve regurgitation. No evidence of mitral stenosis. 3. The aortic valve is normal in structure. Aortic valve regurgitation is mild. No aortic stenosis is present.  FINDINGS Left Ventricle: Left ventricular ejection fraction, by estimation, is 60 to 65%. The left ventricle has normal function. The left ventricle has no regional wall motion abnormalities. The left ventricular internal cavity size was normal in size. There is no left ventricular hypertrophy. Left ventricular diastolic parameters were normal.  Right Ventricle: The right ventricular size is normal. No increase in right ventricular wall thickness. Right ventricular systolic function is normal. There is normal pulmonary artery systolic pressure. The tricuspid regurgitant velocity is 2.31 m/s, and with an assumed right atrial pressure of 3 mmHg, the estimated right ventricular systolic pressure is 24.3 mmHg.  Left Atrium: Left atrial size was normal in size.  Right Atrium:  Right atrial size was normal in size.  Pericardium: There is no evidence of pericardial effusion.  Mitral Valve: The mitral valve is normal in structure. Mild mitral valve regurgitation. No evidence of mitral valve stenosis.  Tricuspid Valve: The tricuspid valve is normal in structure. Tricuspid valve regurgitation is mild . No evidence of tricuspid stenosis.  Aortic Valve: The aortic valve is normal in structure. Aortic valve regurgitation is mild. No aortic stenosis is present.  Pulmonic Valve: The pulmonic valve was normal in structure. Pulmonic valve regurgitation is not visualized. No evidence of pulmonic stenosis.  Aorta: The aortic root is normal in size and structure.  Venous: The inferior vena cava is normal in size with greater than 50% respiratory variability, suggesting right atrial pressure of 3 mmHg.  IAS/Shunts: No atrial level shunt detected by color flow Doppler.   LEFT VENTRICLE PLAX 2D LVIDd:         3.90 cm     Diastology LVIDs:         3.00 cm     LV e' medial:    7.18 cm/s LV PW:         1.20 cm     LV E/e' medial:  7.4 LV IVS:        1.20 cm     LV e' lateral:   12.40 cm/s LVOT diam:     2.30 cm     LV E/e' lateral: 4.3 LV SV:         90 LV SV Index:   42 LVOT Area:     4.15 cm  LV Volumes (MOD) LV vol d, MOD A2C: 66.8 ml LV vol d, MOD A4C: 79.6 ml LV vol s, MOD A2C: 32.3 ml LV vol s, MOD A4C: 39.8 ml LV SV MOD A2C:     34.5 ml LV SV MOD A4C:     79.6 ml LV SV MOD BP:      37.9 ml  RIGHT VENTRICLE  IVC RV S prime:     11.10 cm/s  IVC diam: 1.50 cm TAPSE (M-mode): 2.0 cm  LEFT ATRIUM             Index       RIGHT ATRIUM           Index LA diam:        3.20 cm 1.51 cm/m  RA Area:     11.40 cm LA Vol (A2C):   51.2 ml 24.12 ml/m RA Volume:   23.80 ml  11.21 ml/m LA Vol (A4C):   34.9 ml 16.44 ml/m LA Biplane Vol: 43.5 ml 20.49 ml/m AORTIC VALVE LVOT Vmax:   99.90 cm/s LVOT Vmean:  77.100 cm/s LVOT VTI:    0.216 m  AORTA Ao Root  diam: 3.30 cm Ao Asc diam:  3.20 cm Ao Desc diam: 1.90 cm  MITRAL VALVE               TRICUSPID VALVE MV Area (PHT): 3.39 cm    TR Peak grad:   21.3 mmHg MV Decel Time: 224 msec    TR Vmax:        231.00 cm/s MV E velocity: 53.10 cm/s MV A velocity: 49.70 cm/s  SHUNTS MV E/A ratio:  1.07        Systemic VTI:  0.22 m Systemic Diam: 2.30 cm  Belva Crome MD Electronically signed by Belva Crome MD Signature Date/Time: 09/03/2020/3:33:21 PM    Final    MONITORS  CARDIAC EVENT MONITOR 10/11/2020  Narrative Aeneas Beecham Gerhart, DOB 1970/06/11, MRN [id number]  EVENT MONITOR REPORT:   Patient was monitored from 09/05/2020 to 10/04/2020. Indication:                    Syncope and collapse Ordering physician:  Garwin Brothers, MD Referring physician:  Garwin Brothers, MD   Baseline rhythm: Sinus  Minimum heart rate: 44 BPM.   Maximal heart rate 161 BPM.  Atrial arrhythmia: Rare atrial runs were noted  Ventricular arrhythmia: Occasional PVCs were noted  Conduction abnormality: Patient had sinus rhythm with third-degree AV block and junctional escape.  High degree of AV block was noted.  Symptoms: None significant   Conclusion: Patient was admitted and received a permanent pacemaker for above critical findings.  Interpreting  cardiologist: Garwin Brothers, MD Date: 10/11/2020 5:51 PM   CT SCANS  CT CORONARY MORPH W/CTA COR W/SCORE 08/15/2020  Addendum 08/15/2020 11:23 AM ADDENDUM REPORT: 08/15/2020 11:21  CLINICAL DATA:  52 Year-old Caucasian male  EXAM: Cardiac/Coronary  CTA  TECHNIQUE: The patient was scanned on a [employer].  FINDINGS: A 100 kV prospective scan was triggered in the descending thoracic aorta at 111 HU's. Axial non-contrast 3 mm slices were carried out through the heart. The data set was analyzed on a dedicated work station and scored using the Agatson method. Gantry rotation speed was 250 msecs and collimation was .6 mm. No beta  blockade and 0.8 mg of sl NTG was given. The 3D data set was reconstructed in 5% intervals of the 67-82 % of the R-R cycle. Diastolic phases were analyzed on a dedicated work station using MPR, MIP and VRT modes. The patient received 95 cc of contrast.  Aorta:  Normal size.  No calcifications.  No dissection.  Main Pulmonary Artery: Normal size of the pulmonary artery.  Aortic Valve:  Tri-leaflet.  No calcifications.  Left ventricle: There is notable septal hypertrophy: 16 mm.  Coronary Arteries:  Normal coronary origin.  Left dominance.  Coronary Calcium Score:  Left main: 0  Left anterior descending artery: 17  Left circumflex artery: 0  Right coronary artery: 0  Total: 17  Percentile: 78th for age, sex, and race matched control.  RCA is a non-dominant artery that gives rise to PDA and PLA. There is no plaque.  Left main is a large artery that gives rise to LAD and LCX arteries. There is no plaque.  LAD is a large vessel that gives rise to one large D1 Branch and two smaller D2 & D3 vessels. There is a mild non obstructive (25-49%) calcified plaque in the proximal D1. There is a mild non obstructive (25-49%) calcified plaque in the mid LAD.  LCX is a dominant artery that gives rise to one large OM1 branch that bifurcates and a smaller OM2 vessel. There is no plaque.  Other findings:  Normal pulmonary vein drainage into the left atrium.  Normal left atrial appendage without a thrombus.  Mild dilation of the coronary sinus: 14 mm.  Extra-cardiac findings: See attached radiology report for non-cardiac structures.  There is a small stair-step artifact most notable between coronal slice 87 and 88.  IMPRESSION: 1. Coronary calcium score of 17. This was 78th percentile for age, sex, and race matched control.  2. Normal coronary origin with left dominance.  3. There is notable septal hypertrophy: 16 mm consider correlation with echocardiogram if clinically  indicated.  4. CAD-RADS 2. Mild non-obstructive CAD (25-49%). Consider non-atherosclerotic causes of chest pain. Consider preventive therapy and risk factor modification.  RECOMMENDATIONS:  Coronary artery calcium (CAC) score is a strong predictor of incident coronary heart disease (CHD) and provides predictive information beyond traditional risk factors. CAC scoring is reasonable to use in the decision to withhold, postpone, or initiate statin therapy in intermediate-risk or selected borderline-risk asymptomatic adults (age 82-75 years and LDL-C >=70 to <190 mg/dL) who do not have diabetes or established atherosclerotic cardiovascular disease (ASCVD).* In intermediate-risk (10-year ASCVD risk >=7.5% to <20%) adults or selected borderline-risk (10-year ASCVD risk >=5% to <7.5%) adults in whom a CAC score is measured for the purpose of making a treatment decision the following recommendations have been made:  If CAC = 0, it is reasonable to withhold statin therapy and reassess in 5 to 10 years, as long as higher risk conditions are absent (diabetes mellitus, family history of premature CHD in first degree relatives (males <55 years; females <65 years), cigarette smoking, LDL >=190 mg/dL or other independent risk factors).  If CAC is 1 to 99, it is reasonable to initiate statin therapy for patients ?51 years of age.  If CAC is >=100 or >=75th percentile, it is reasonable to initiate statin therapy at any age.  Cardiology referral should be considered for patients with CAC scores =400 or >=75th percentile.  *2018 AHA/ACC/AACVPR/AAPA/ABC/ACPM/ADA/AGS/APhA/ASPC/NLA/PCNA Guideline on the Management of Blood Cholesterol: A Report of the American College of Cardiology/American Heart Association Task Force on Clinical Practice Guidelines. J Am Coll Cardiol. 2019;73(24):3168-3209.  Riley Lam, MD   Electronically Signed By: Riley Lam MD On: 08/15/2020  11:21  Narrative EXAM: OVER-READ INTERPRETATION  CT CHEST  The following report is an over-read performed by radiologist Dr. Irish Lack of [facility] on 08/15/2020. This over-read does not include interpretation of cardiac or coronary anatomy or pathology. The coronary CTA interpretation by the cardiologist is attached.  COMPARISON:  None.  FINDINGS: Vascular: No significant noncardiac vascular findings.  Mediastinum/Nodes: Visualized mediastinum  and hilar regions demonstrate no lymphadenopathy or masses.  Lungs/Pleura: Visualized lungs show no evidence of pulmonary edema, consolidation, pneumothorax, nodule or pleural fluid.  Upper Abdomen: No acute abnormality.  Musculoskeletal: No chest wall mass or suspicious bone lesions identified.  IMPRESSION: No significant incidental findings.  Electronically Signed: By: Irish Lack M.D. On: 08/15/2020 09:55   CARDIAC MRI  MR CARDIAC MORPHOLOGY W WO CONTRAST 09/18/2020  Narrative CLINICAL DATA:  Evaluate for HCM  EXAM: CARDIAC MRI  TECHNIQUE: The patient was scanned on a 1.5 Tesla Siemens magnet. A dedicated cardiac coil was used. Functional imaging was done using Fiesta sequences. 2,3, and 4 chamber views were done to assess for RWMA's. Modified Simpson's rule using a short axis stack was used to calculate an ejection fraction on a dedicated work [employer]. The patient received 13 cc of Gadavist. After 10 minutes inversion recovery sequences were used to assess for infiltration and scar tissue.  CONTRAST:  13 cc  of Gadavist  FINDINGS: Left ventricle:  -Mild asymmetric hypertrophy measuring 12mm in basal septum (8mm in posterior wall)  -Normal size  -Low normal systolic function  -Paradoxical septal motion consistent with LBBB  -Normal ECV (24%)  -No LGE  LV EF: 51% (Normal 56-78%)  Absolute volumes:  LV EDV: (Normal 77-195 mL)  LV ESV: 78mL (Normal  19-72 mL)  LV SV: 81mL (Normal 51-133 mL)  CO: 4.6L/min (Normal 2.8-8.8 L/min)  Indexed volumes:  LV EDV: 11mL/sq-m (Normal 47-92 mL/sq-m)  LV ESV: 9mL/sq-m (Normal 13-30 mL/sq-m)  LV SV: 45mL/sq-m (Normal 32-62 mL/sq-m)  CI: 2.2L/min/sq-m (Normal 1.7-4.2 L/min/sq-m)  Right ventricle: Normal size and systolic function  RV EF:  52% (Normal 47-74%)  Absolute volumes:  RV EDV: (Normal 88-227 mL)  RV ESV: 81mL (Normal 23-103 mL)  RV SV: 89mL (Normal 52-138 mL)  CO: 5.1L/min (Normal 2.8-8.8 L/min)  Indexed volumes:  RV EDV: 108mL/sq-m (Normal 55-105 mL/sq-m)  RV ESV: 79mL/sq-m (Normal 15-43 mL/sq-m)  RV SV: 49mL/sq-m (Normal 32-64 mL/sq-m)  CI: 2.4L/min/sq-m (Normal 1.7-4.2 L/min/sq-m)  Left atrium: Mild enlargement  Right atrium: Mild enlargement  Mitral valve: Mild regurgitation  Aortic valve: Mild regurgitation  Tricuspid valve: Mild regurgitation  Pulmonic valve: No regurgitation  Aorta: Normal proximal ascending aorta  Pericardium: Normal  IMPRESSION: 1. Mild asymmetric LV hypertrophy measuring 12mm in basal septum (8mm in posterior wall). Does not meet criteria for hypertrophic cardiomyopathy (wall thickness <56mm)  2.  No late gadolinium enhancement to suggest myocardial scar  3. Normal LV size and low normal systolic function (EF 51%). Paradoxical septal motion consistent with left bundle branch block  4.  Normal RV size and systolic function (EF 52%)   Electronically Signed By: Epifanio Lesches MD On: 09/18/2020 12:13         Lab Results  Component Value Date   WBC 5.2 09/18/2020   HGB 15.2 09/18/2020   HCT 44.4 09/18/2020   MCV 98.2 09/18/2020   PLT 166 09/18/2020   Lab Results  Component Value Date   CREATININE 1.02 02/12/2022   BUN 15 02/12/2022   NA 140 02/12/2022   K 4.3 02/12/2022   CL 103 02/12/2022   CO2 31 02/12/2022   Lab Results  Component Value Date   ALT 24 02/12/2022   AST 19 02/12/2022    ALKPHOS 51 02/12/2022   BILITOT 0.6 02/12/2022   Lab Results  Component Value Date   CHOL 142 02/12/2022   HDL 59.00 02/12/2022   LDLCALC 46  02/12/2022   TRIG 186.0 (H) 02/12/2022   CHOLHDL 2 02/12/2022    Lab Results  Component Value Date   HGBA1C 5.6 02/12/2022     Assessment & Plan    1.  Nonobstructive CAD: -completed a coronary calcium score 07/2020 that showed calcium score 17 and mild nonobstructive CAD -Today patient reports no complaints of chest pain or anginal discomfort. -Continue GDMT with ASA 81 mg, Crestor 10 mg daily  2.  History of CHB: -s/p Medtronic PPM with normal Paceart and lead and battery function.  3.  Hyperlipidemia: -Patient's last LDL cholesterol was 59 -Continue Crestor 10 mg daily -We will check lipids and LFTs in December  4.  Essential hypertension: -Patient blood pressure today was well-controlled at 106/70 -Continue lifestyle modification stay from excess salt.  5.  LBBB: -Patient's last QRS was 152 -Patient is being monitored by EP with no new symptoms since his visit   Disposition: Follow-up with Orbie Pyo, MD or APP in 12 months    Medication Adjustments/Labs and Tests Ordered: Current medicines are reviewed at length with the patient today.  Concerns regarding medicines are outlined above.   Signed, Napoleon Form, Leodis Rains, NP 08/04/2022, 1:37 PM  [facility]

## 2022-08-05 ENCOUNTER — Encounter: Payer: Self-pay | Admitting: Nurse Practitioner

## 2022-08-05 ENCOUNTER — Ambulatory Visit: Payer: BC Managed Care – PPO | Attending: Physician Assistant | Admitting: Nurse Practitioner

## 2022-08-05 VITALS — BP 106/70 | HR 83 | Ht 69.0 in | Wt 212.0 lb

## 2022-08-05 DIAGNOSIS — I447 Left bundle-branch block, unspecified: Secondary | ICD-10-CM

## 2022-08-05 DIAGNOSIS — E782 Mixed hyperlipidemia: Secondary | ICD-10-CM | POA: Diagnosis not present

## 2022-08-05 DIAGNOSIS — Z950 Presence of cardiac pacemaker: Secondary | ICD-10-CM | POA: Diagnosis not present

## 2022-08-05 DIAGNOSIS — I2510 Atherosclerotic heart disease of native coronary artery without angina pectoris: Secondary | ICD-10-CM | POA: Diagnosis not present

## 2022-08-05 DIAGNOSIS — I10 Essential (primary) hypertension: Secondary | ICD-10-CM

## 2022-08-05 MED ORDER — ROSUVASTATIN CALCIUM 10 MG PO TABS: 10.0000 mg | ORAL_TABLET | Freq: Every day | ORAL | 3 refills | Status: DC

## 2022-08-05 NOTE — Patient Instructions (Addendum)
Medication Instructions:  Your physician recommends that you continue on your current medications as directed. Please refer to the Current Medication list given to you today. *If you need a refill on your cardiac medications before your next appointment, please call your pharmacy*   Lab Work: DECEMBER 2024 LFT & LIPIDS If you have labs (blood work) drawn today and your tests are completely normal, you will receive your results only by: MyChart Message (if you have MyChart) OR A paper copy in the mail If you have any lab test that is abnormal or we need to change your treatment, we will call you to review the results.   Testing/Procedures: NONE ORDERED   Follow-Up: At [facility], you and your health needs are our priority.  As part of our continuing mission to provide you with exceptional heart care, we have created designated Provider Care Teams.  These Care Teams include your primary Cardiologist (physician) and Advanced Practice Providers (APPs -  Physician Assistants and Nurse Practitioners) who all work together to provide you with the care you need, when you need it.  We recommend signing up for the patient portal called "MyChart".  Sign up information is provided on this After Visit Summary.  MyChart is used to connect with patients for Virtual Visits (Telemedicine).  Patients are able to view lab/test results, encounter notes, upcoming appointments, etc.  Non-urgent messages can be sent to your provider as well.   To learn more about what you can do with MyChart, go to [email or web address].    Your next appointment:   1 year(s)  Provider:   Orbie Pyo, MD  or Robin Searing, NP   Other Instructions

## 2022-09-17 ENCOUNTER — Ambulatory Visit (INDEPENDENT_AMBULATORY_CARE_PROVIDER_SITE_OTHER): Payer: BC Managed Care – PPO

## 2022-09-17 DIAGNOSIS — I447 Left bundle-branch block, unspecified: Secondary | ICD-10-CM | POA: Diagnosis not present

## 2022-09-18 LAB — CUP PACEART REMOTE DEVICE CHECK
Battery Remaining Longevity: 153 mo
Battery Voltage: 3.06 V
Brady Statistic AP VP Percent: 0.01 %
Brady Statistic AP VS Percent: 4.14 %
Brady Statistic AS VP Percent: 0.04 %
Brady Statistic AS VS Percent: 95.81 %
Brady Statistic RA Percent Paced: 4.15 %
Brady Statistic RV Percent Paced: 0.05 %
Date Time Interrogation Session: 20240801142350
Implantable Lead Connection Status: 753985
Implantable Lead Connection Status: 753985
Implantable Lead Implant Date: 20220802
Implantable Lead Implant Date: 20220802
Implantable Lead Location: 753859
Implantable Lead Location: 753860
Implantable Lead Model: 3830
Implantable Lead Model: 5076
Implantable Pulse Generator Implant Date: 20220802
Lead Channel Impedance Value: 361 Ohm
Lead Channel Impedance Value: 380 Ohm
Lead Channel Impedance Value: 456 Ohm
Lead Channel Impedance Value: 532 Ohm
Lead Channel Pacing Threshold Amplitude: 0.5 V
Lead Channel Pacing Threshold Amplitude: 1.125 V
Lead Channel Pacing Threshold Pulse Width: 0.4 ms
Lead Channel Pacing Threshold Pulse Width: 0.4 ms
Lead Channel Sensing Intrinsic Amplitude: 1.5 mV
Lead Channel Sensing Intrinsic Amplitude: 1.5 mV
Lead Channel Sensing Intrinsic Amplitude: 22.875 mV
Lead Channel Sensing Intrinsic Amplitude: 22.875 mV
Lead Channel Setting Pacing Amplitude: 1.5 V
Lead Channel Setting Pacing Amplitude: 2.5 V
Lead Channel Setting Pacing Pulse Width: 0.4 ms
Lead Channel Setting Sensing Sensitivity: 2.8 mV
Zone Setting Status: 755011

## 2022-10-02 NOTE — Progress Notes (Signed)
Remote pacemaker transmission.   

## 2022-10-20 IMAGING — CT CT HEART MORP W/ CTA COR W/ SCORE W/ CA W/CM &/OR W/O CM
4 of 7 series · 8 of 20 positions shown, 9 images · non-contrast
Comparison: None.
COMPARISON: None.

Addendum:
EXAM:
OVER-READ INTERPRETATION  CT CHEST

The following report is an over-read performed by radiologist Dr.
Zac Roses [REDACTED] on 08/15/2020. This
over-read does not include interpretation of cardiac or coronary
anatomy or pathology. The coronary CTA interpretation by the
cardiologist is attached.
CLINICAL DATA: 49 Year-old Caucasian male
Cardiac/Coronary  CTA
TECHNIQUE: The patient was scanned on a [employer] Force scanner.

[Series 6: best diast · axial · 0.38mm/px · z∈[-157,-122]mm · 2 of 261 slices shown, 3 images]
[im 87/261  vessel]
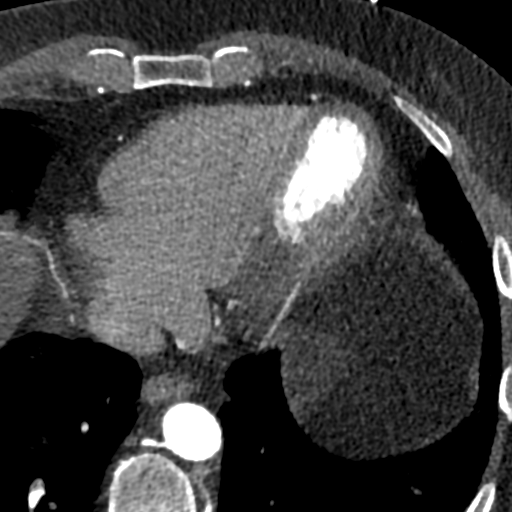
[im 87/261  lung]
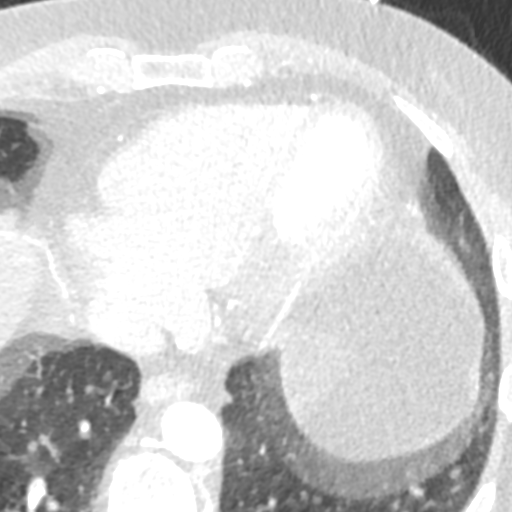
[im 174/261  vessel]
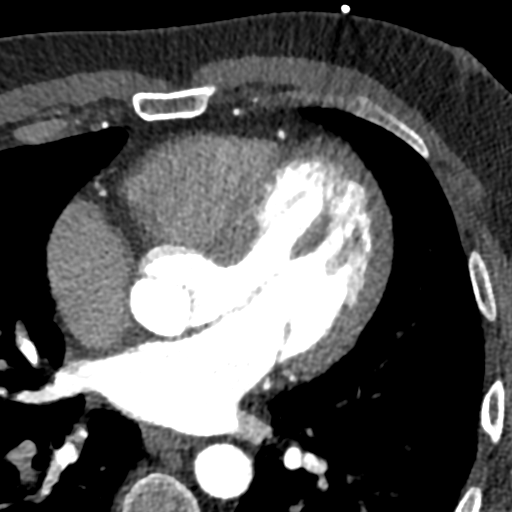

[Series 7: ts syst sharp · axial · 0.38mm/px · z∈[-157,-122]mm · 2 of 261 slices shown]
[im 87/261  lung]
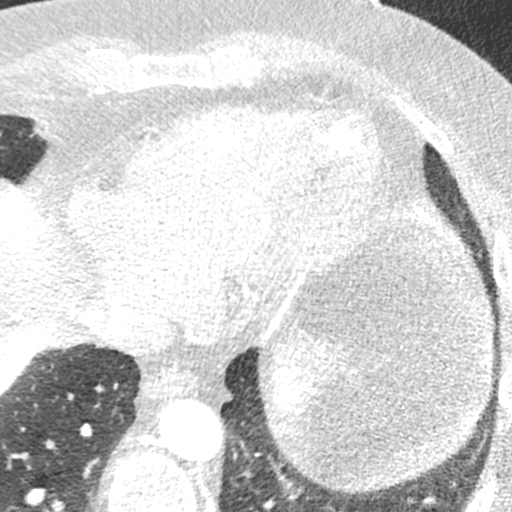
[im 174/261  lung]
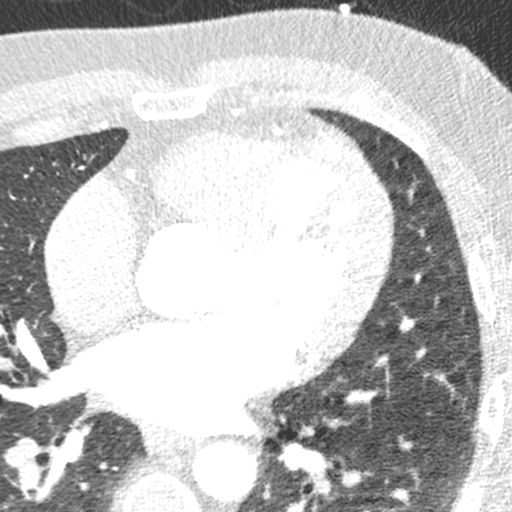

[Series 8: best syst · axial · 0.38mm/px · z∈[-157,-122]mm · 2 of 261 slices shown]
[im 87/261  vessel]
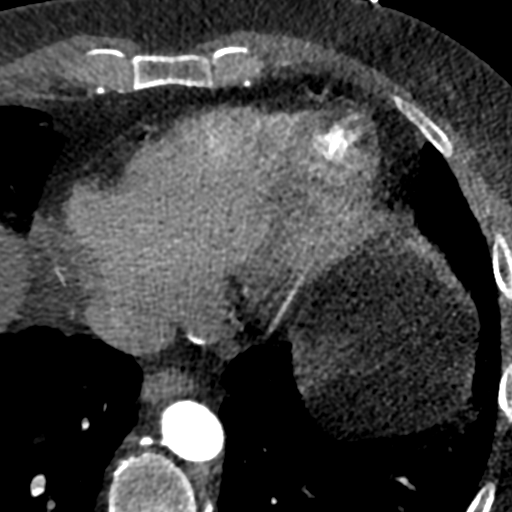
[im 174/261  vessel]
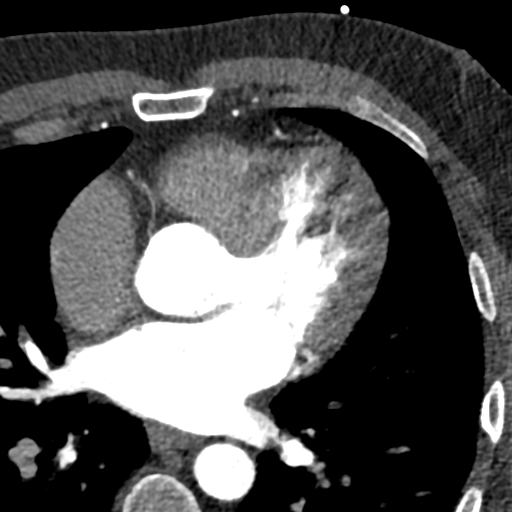

[Series 9: ts diast sharp · axial · 0.38mm/px · z∈[-157,-122]mm · 2 of 261 slices shown]
[im 87/261  lung]
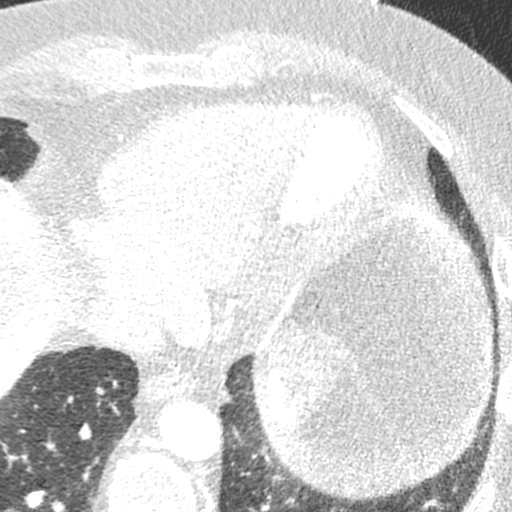
[im 174/261  lung]
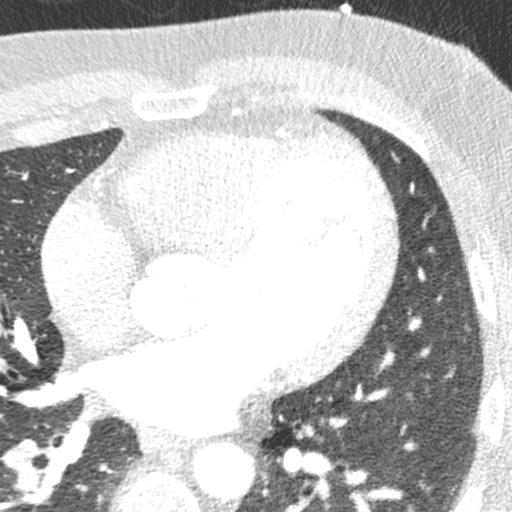

[8 of 20 positions shown; findings below may reference images not displayed]

FINDINGS: Vascular: No significant noncardiac vascular findings.

Mediastinum/Nodes: Visualized mediastinum and hilar regions
demonstrate no lymphadenopathy or masses.

Lungs/Pleura: Visualized lungs show no evidence of pulmonary edema,
consolidation, pneumothorax, nodule or pleural fluid.

Upper Abdomen: No acute abnormality.

Musculoskeletal: No chest wall mass or suspicious bone lesions
identified.
IMPRESSION: No significant incidental findings.
FINDINGS: A 100 kV prospective scan was triggered in the descending thoracic
aorta at 111 HU's. Axial non-contrast 3 mm slices were carried out
through the heart. The data set was analyzed on a dedicated work
station and scored using the Agatson method. Gantry rotation speed
was 250 msecs and collimation was .6 mm. No beta blockade and 0.8 mg
of sl NTG was given. The 3D data set was reconstructed in 5%
intervals of the 67-82 % of the R-R cycle. Diastolic phases were
analyzed on a dedicated work station using MPR, MIP and VRT modes.
The patient received 95 cc of contrast.

Aorta:  Normal size.  No calcifications.  No dissection.

Main Pulmonary Artery: Normal size of the pulmonary artery.

Aortic Valve:  Tri-leaflet.  No calcifications.

Left ventricle: There is notable septal hypertrophy: 16 mm.

Coronary Arteries:  Normal coronary origin.  Left dominance.

Coronary Calcium Score:

Left main: 0

Left anterior descending artery: 17

Left circumflex artery: 0

Right coronary artery: 0

Total: 17

Percentile: 78th for age, sex, and race matched control.

RCA is a non-dominant artery that gives rise to PDA and PLA. There
is no plaque.

Left main is a large artery that gives rise to LAD and LCX arteries.
There is no plaque.

LAD is a large vessel that gives rise to one large D1 Branch and two
smaller D2 & D3 vessels. There is a mild non obstructive (25-49%)
calcified plaque in the proximal D1. There is a mild non obstructive
(25-49%) calcified plaque in the mid LAD.

LCX is a dominant artery that gives rise to one large OM1 branch
that bifurcates and a smaller OM2 vessel. There is no plaque.

Other findings:

Normal pulmonary vein drainage into the left atrium.

Normal left atrial appendage without a thrombus.

Mild dilation of the coronary sinus: 14 mm.

Extra-cardiac findings: See attached radiology report for
non-cardiac structures.

There is a small stair-step artifact most notable between coronal
slice 87 and 88.
IMPRESSION: 1. Coronary calcium score of 17. This was 78th percentile for age,
sex, and race matched control.

2. Normal coronary origin with left dominance.

3. There is notable septal hypertrophy: 16 mm consider correlation
with echocardiogram if clinically indicated.

4. CAD-RADS 2. Mild non-obstructive CAD (25-49%). Consider
non-atherosclerotic causes of chest pain. Consider preventive
therapy and risk factor modification.

RECOMMENDATIONS:



If CAC = 0, it is reasonable to withhold statin therapy and reassess
in 5 to 10 years, as long as higher risk conditions are absent
(diabetes mellitus, family history of premature CHD in first degree
relatives (males <55 years; females <65 years), cigarette smoking,
LDL >=190 mg/dL or other independent risk factors).

If CAC is 1 to 99, it is reasonable to initiate statin therapy for
patients ?55 years of age.

If CAC is >=100 or >=75th percentile, it is reasonable to initiate
statin therapy at any age.

Cardiology referral should be considered for patients with CAC
scores =400 or >=75th percentile.

*4255 AHA/ACC/AACVPR/AAPA/ABC/GILLES/IESHA/DY/[place]/TE BOEKHORST/IZZNUL/PUELLO
Guideline on the Management of Blood Cholesterol: A Report of the
American College of Cardiology/American Heart Association Task Force
on Clinical Practice Guidelines. J Am Coll Cardiol.
7276;73(24):4072-4560.

*** End of Addendum ***
EXAM:
OVER-READ INTERPRETATION  CT CHEST

The following report is an over-read performed by radiologist Dr.
Zac Roses [REDACTED] on 08/15/2020. This
over-read does not include interpretation of cardiac or coronary
anatomy or pathology. The coronary CTA interpretation by the
cardiologist is attached.
FINDINGS: Vascular: No significant noncardiac vascular findings.

Mediastinum/Nodes: Visualized mediastinum and hilar regions
demonstrate no lymphadenopathy or masses.

Lungs/Pleura: Visualized lungs show no evidence of pulmonary edema,
consolidation, pneumothorax, nodule or pleural fluid.

Upper Abdomen: No acute abnormality.

Musculoskeletal: No chest wall mass or suspicious bone lesions
identified.
IMPRESSION: No significant incidental findings.

## 2022-12-17 ENCOUNTER — Ambulatory Visit (INDEPENDENT_AMBULATORY_CARE_PROVIDER_SITE_OTHER): Payer: BC Managed Care – PPO

## 2022-12-17 DIAGNOSIS — I447 Left bundle-branch block, unspecified: Secondary | ICD-10-CM

## 2022-12-17 LAB — CUP PACEART REMOTE DEVICE CHECK
Battery Remaining Longevity: 151 mo
Battery Voltage: 3.06 V
Brady Statistic AP VP Percent: 0.01 %
Brady Statistic AP VS Percent: 2.03 %
Brady Statistic AS VP Percent: 0.03 %
Brady Statistic AS VS Percent: 97.93 %
Brady Statistic RA Percent Paced: 2.04 %
Brady Statistic RV Percent Paced: 0.04 %
Date Time Interrogation Session: 20241029205108
Implantable Lead Connection Status: 753985
Implantable Lead Connection Status: 753985
Implantable Lead Implant Date: 20220802
Implantable Lead Implant Date: 20220802
Implantable Lead Location: 753859
Implantable Lead Location: 753860
Implantable Lead Model: 3830
Implantable Lead Model: 5076
Implantable Pulse Generator Implant Date: 20220802
Lead Channel Impedance Value: 361 Ohm
Lead Channel Impedance Value: 399 Ohm
Lead Channel Impedance Value: 475 Ohm
Lead Channel Impedance Value: 532 Ohm
Lead Channel Pacing Threshold Amplitude: 0.5 V
Lead Channel Pacing Threshold Amplitude: 1.25 V
Lead Channel Pacing Threshold Pulse Width: 0.4 ms
Lead Channel Pacing Threshold Pulse Width: 0.4 ms
Lead Channel Sensing Intrinsic Amplitude: 2 mV
Lead Channel Sensing Intrinsic Amplitude: 2 mV
Lead Channel Sensing Intrinsic Amplitude: 23.25 mV
Lead Channel Sensing Intrinsic Amplitude: 23.25 mV
Lead Channel Setting Pacing Amplitude: 1.5 V
Lead Channel Setting Pacing Amplitude: 2.5 V
Lead Channel Setting Pacing Pulse Width: 0.4 ms
Lead Channel Setting Sensing Sensitivity: 2.8 mV
Zone Setting Status: 755011

## 2023-01-06 NOTE — Progress Notes (Signed)
Remote pacemaker transmission.   

## 2023-02-02 ENCOUNTER — Ambulatory Visit: Payer: BC Managed Care – PPO

## 2023-02-02 DIAGNOSIS — I447 Left bundle-branch block, unspecified: Secondary | ICD-10-CM

## 2023-02-02 DIAGNOSIS — I10 Essential (primary) hypertension: Secondary | ICD-10-CM

## 2023-02-02 DIAGNOSIS — E782 Mixed hyperlipidemia: Secondary | ICD-10-CM

## 2023-02-02 DIAGNOSIS — I2510 Atherosclerotic heart disease of native coronary artery without angina pectoris: Secondary | ICD-10-CM

## 2023-02-02 DIAGNOSIS — Z950 Presence of cardiac pacemaker: Secondary | ICD-10-CM

## 2023-02-26 ENCOUNTER — Ambulatory Visit: Payer: Self-pay | Admitting: Family Medicine

## 2023-02-26 NOTE — Telephone Encounter (Signed)
 Copied from CRM [id number]. Topic: Clinical - Pink Word Triage >> Feb 26, 2023  3:19 PM Joanell B wrote: Reason for Triage: Pt stated that he has not been feeling well but not like a sick feeling, shortness of breath for about 1 month, fatigue, he also had a pace maker put in August of 2022.  Chief Complaint: diffculty breathing - mostly at night Symptoms: difficulty breathing that wakes him up at night; weak and tired during the day.  Frequency: constant at hs. Pertinent Negatives: Patient denies fever, cp, dizziness Disposition: [] ED /[] Urgent Care (no appt availability in office) / [] Appointment(In office/virtual)/ []  [facility]/ [] Home Care/ [] Refused Recommended Disposition /[] [facility]/ []  Follow-up with PCP Additional Notes: patient c/o difficulty breathing at hs.  States it wakes him out of sleep.  States has been going on since Nov.  NAD, able to speak full sentences.  Apt made for Monday am.  Care advice given; denies questions.  Instructed to go to er if becomes worse.   Reason for Disposition  [1] MILD longstanding difficulty breathing AND [2]  SAME as normal  Answer Assessment - Initial Assessment Questions 1. RESPIRATORY STATUS: Describe your breathing? (e.g., wheezing, shortness of breath, unable to speak, severe coughing)      Occurs at night and feel like I can't get a full breath in 2. ONSET: When did this breathing problem begin?      End of Nov.  3. PATTERN Does the difficult breathing come and go, or has it been constant since it started?      Has waken me up at night 4. SEVERITY: How bad is your breathing? (e.g., mild, moderate, severe)    - MILD: No SOB at rest, mild SOB with walking, speaks normally in sentences, can lie down, no retractions, pulse < 100.    - MODERATE: SOB at rest, SOB with minimal exertion and prefers to sit, cannot lie down flat, speaks in phrases, mild retractions, audible wheezing, pulse 100-120.    - SEVERE:  Very SOB at rest, speaks in single words, struggling to breathe, sitting hunched forward, retractions, pulse > 120      At night 5. RECURRENT SYMPTOM: Have you had difficulty breathing before? If Yes, ask: When was the last time? and What happened that time?      no 6. CARDIAC HISTORY: Do you have any history of heart disease? (e.g., heart attack, angina, bypass surgery, angioplasty)      Pace maker 7. LUNG HISTORY: Do you have any history of lung disease?  (e.g., pulmonary embolus, asthma, emphysema)     denies 8. CAUSE: What do you think is causing the breathing problem?      unknown 9. OTHER SYMPTOMS: Do you have any other symptoms? (e.g., dizziness, runny nose, cough, chest pain, fever)     Denies.  12. TRAVEL: Have you traveled out of the country in the last month? (e.g., travel history, exposures)       denies  Protocols used: Breathing Difficulty-A-AH

## 2023-02-27 NOTE — Telephone Encounter (Signed)
 Noted.

## 2023-03-02 ENCOUNTER — Ambulatory Visit (INDEPENDENT_AMBULATORY_CARE_PROVIDER_SITE_OTHER): Payer: Self-pay | Admitting: Family Medicine

## 2023-03-02 ENCOUNTER — Encounter: Payer: Self-pay | Admitting: Family Medicine

## 2023-03-02 ENCOUNTER — Ambulatory Visit (INDEPENDENT_AMBULATORY_CARE_PROVIDER_SITE_OTHER): Payer: 59

## 2023-03-02 VITALS — BP 110/84 | HR 71 | Temp 98.3°F | Wt 223.0 lb

## 2023-03-02 DIAGNOSIS — F411 Generalized anxiety disorder: Secondary | ICD-10-CM

## 2023-03-02 DIAGNOSIS — R0602 Shortness of breath: Secondary | ICD-10-CM

## 2023-03-02 LAB — CBC WITH DIFFERENTIAL/PLATELET
Basophils Absolute: 0 10*3/uL (ref 0.0–0.1)
Basophils Relative: 0.4 % (ref 0.0–3.0)
Eosinophils Absolute: 0.2 10*3/uL (ref 0.0–0.7)
Eosinophils Relative: 3.6 % (ref 0.0–5.0)
HCT: 50.3 % (ref 39.0–52.0)
Hemoglobin: 16.7 g/dL (ref 13.0–17.0)
Lymphocytes Relative: 36.9 % (ref 12.0–46.0)
Lymphs Abs: 1.8 10*3/uL (ref 0.7–4.0)
MCHC: 33.2 g/dL (ref 30.0–36.0)
MCV: 99.9 fL (ref 78.0–100.0)
Monocytes Absolute: 0.4 10*3/uL (ref 0.1–1.0)
Monocytes Relative: 7.9 % (ref 3.0–12.0)
Neutro Abs: 2.5 10*3/uL (ref 1.4–7.7)
Neutrophils Relative %: 51.2 % (ref 43.0–77.0)
Platelets: 170 10*3/uL (ref 150.0–400.0)
RBC: 5.03 Mil/uL (ref 4.22–5.81)
RDW: 12.8 % (ref 11.5–15.5)
WBC: 4.9 10*3/uL (ref 4.0–10.5)

## 2023-03-02 LAB — HEPATIC FUNCTION PANEL
ALT: 19 U/L (ref 0–53)
AST: 20 U/L (ref 0–37)
Albumin: 4.6 g/dL (ref 3.5–5.2)
Alkaline Phosphatase: 56 U/L (ref 39–117)
Bilirubin, Direct: 0.1 mg/dL (ref 0.0–0.3)
Total Bilirubin: 0.6 mg/dL (ref 0.2–1.2)
Total Protein: 7.6 g/dL (ref 6.0–8.3)

## 2023-03-02 LAB — BASIC METABOLIC PANEL
BUN: 14 mg/dL (ref 6–23)
CO2: 29 meq/L (ref 19–32)
Calcium: 9.8 mg/dL (ref 8.4–10.5)
Chloride: 103 meq/L (ref 96–112)
Creatinine, Ser: 1.02 mg/dL (ref 0.40–1.50)
GFR: 85.19 mL/min (ref >60.00)
Glucose, Bld: 86 mg/dL (ref 70–99)
Potassium: 4.3 meq/L (ref 3.5–5.1)
Sodium: 138 meq/L (ref 135–145)

## 2023-03-02 LAB — TSH: TSH: 2.75 u[IU]/mL (ref 0.35–5.50)

## 2023-03-02 MED ORDER — ESCITALOPRAM OXALATE 10 MG PO TABS
10.0000 mg | ORAL_TABLET | Freq: Every day | ORAL | 1 refills | Status: DC
Start: 2023-03-02 — End: 2023-03-24

## 2023-03-02 NOTE — Progress Notes (Signed)
   Subjective:    Patient ID: Joel Lynch, male    DOB: [birth year], 53 y.o.   MRN: [id number]  HPI Here complaining of SOB at rest for the past 3 months. He feels a sensation that he cannot take a deep breath at times, either when when sitting or lying in bed. Often if he yawns, this sensation goes away. He never has SOB on exertion or during the day when he is walking around. No chest pain. 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 He had a normal Lexiscan  Myoview  prior to this procedure, and an ECHO showed an EF of 50-55%. He denies any heartburn or indigestion. He admits to feeling a lot of anxiety. He is a professor at [employer], and his job is stressful. Then when he goes home, he says he has to deal with his wife who has a lot of anxiety.    Review of Systems  Constitutional: Negative.   Respiratory:  Positive for shortness of breath. Negative for cough and wheezing.   Cardiovascular: Negative.   Gastrointestinal: Negative.   Genitourinary: Negative.   Neurological: Negative.   Psychiatric/Behavioral:  Negative for agitation, behavioral problems, confusion, decreased concentration, hallucinations and sleep disturbance. The patient is nervous/anxious.        Objective:   Physical Exam Constitutional:      General: He is not in acute distress.    Appearance: Normal appearance.  Cardiovascular:     Rate and Rhythm: Normal rate and regular rhythm.     Pulses: Normal pulses.     Heart sounds: Normal heart sounds.  Pulmonary:     Effort: Pulmonary effort is normal.     Breath sounds: Normal breath sounds.  Musculoskeletal:     Right lower leg: No edema.     Left lower leg: No edema.  Neurological:     General: No focal deficit present.     Mental Status: He is alert and oriented to person, place, and time.  Psychiatric:        Behavior: Behavior normal.        Thought Content: Thought content normal.     Comments: He is a bit anxious             Assessment & Plan:  He is describing SOB at rest, and this is most likely due to anxiety. We will get a CXR and labs today to rule out other possible etiologies. He agrees to start treatment for this, so he will start taking Lexapro  10 mg daily. He will follow up with Dr. [name] in 3-4 weeks.  Garnette Olmsted, MD

## 2023-03-18 ENCOUNTER — Ambulatory Visit (INDEPENDENT_AMBULATORY_CARE_PROVIDER_SITE_OTHER): Payer: BC Managed Care – PPO

## 2023-03-18 DIAGNOSIS — I447 Left bundle-branch block, unspecified: Secondary | ICD-10-CM | POA: Diagnosis not present

## 2023-03-23 LAB — CUP PACEART REMOTE DEVICE CHECK
Battery Remaining Longevity: 149 mo
Battery Voltage: 3.05 V
Brady Statistic AP VP Percent: 0.01 %
Brady Statistic AP VS Percent: 4.92 %
Brady Statistic AS VP Percent: 0.03 %
Brady Statistic AS VS Percent: 95.04 %
Brady Statistic RA Percent Paced: 4.93 %
Brady Statistic RV Percent Paced: 0.04 %
Date Time Interrogation Session: 20250131082433
Implantable Lead Connection Status: 753985
Implantable Lead Connection Status: 753985
Implantable Lead Implant Date: 20220802
Implantable Lead Implant Date: 20220802
Implantable Lead Location: 753859
Implantable Lead Location: 753860
Implantable Lead Model: 3830
Implantable Lead Model: 5076
Implantable Pulse Generator Implant Date: 20220802
Lead Channel Impedance Value: 361 Ohm
Lead Channel Impedance Value: 380 Ohm
Lead Channel Impedance Value: 456 Ohm
Lead Channel Impedance Value: 513 Ohm
Lead Channel Pacing Threshold Amplitude: 0.5 V
Lead Channel Pacing Threshold Amplitude: 1.25 V
Lead Channel Pacing Threshold Pulse Width: 0.4 ms
Lead Channel Pacing Threshold Pulse Width: 0.4 ms
Lead Channel Sensing Intrinsic Amplitude: 1.75 mV
Lead Channel Sensing Intrinsic Amplitude: 1.75 mV
Lead Channel Sensing Intrinsic Amplitude: 23.375 mV
Lead Channel Sensing Intrinsic Amplitude: 23.375 mV
Lead Channel Setting Pacing Amplitude: 1.5 V
Lead Channel Setting Pacing Amplitude: 2.5 V
Lead Channel Setting Pacing Pulse Width: 0.4 ms
Lead Channel Setting Sensing Sensitivity: 2.8 mV
Zone Setting Status: 755011

## 2023-03-23 NOTE — Progress Notes (Signed)
 HPI: Joel Lynch is a 53 y.o. 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  Exercise: Patient admits he is not exercising.  Diet: He eats healthy in general, and tries to eat vegetables daily. He snacks on chips and popcorn.  Sleep: 8-9 hours per night.  Alcohol Use: ~3 beers per week Smoking: never Vision: He hasn't been for a couple of years.  Dental: UTD on routine dental care.   Health Maintenance  Topic Date Due   COVID-19 Vaccine (1 - 2024-25 season) 04/09/2023*   DTaP/Tdap/Td vaccine (3 - Td or Tdap) 02/06/2031   Colon Cancer Screening  07/03/2031   Flu Shot  Completed   Hepatitis C Screening  Completed   HIV Screening  Completed   Zoster (Shingles) Vaccine  Completed   HPV Vaccine  Aged Out   Pneumococcal Vaccination  Discontinued  *Topic was postponed. The date shown is not the original due date.   Immunization History  Administered Date(s) Administered   Influenza, Seasonal, Injecte, Preservative Fre 03/24/2023   Influenza,inj,Quad PF,6+ Mos 12/31/2018, 11/06/2020, 02/12/2022   Td 03/07/2010   Tdap 02/05/2021   Zoster Recombinant(Shingrix ) 02/12/2022, 05/15/2022   Last prostate cancer screening: 01/2022 He has nocturia 1-2x per night, stable for a while. Lab Results  Component Value Date   PSA 0.35 02/12/2022   Chronic Problems:   CHB s/p Medtronic PPM 09/2020. He follows with cardiologist annually.  Hyperlipidemia: Currently on rosuvastatin  10 mg daily.  Side effects from medication: none Lab Results  Component Value Date   CHOL 142 02/12/2022   HDL 59.00 02/12/2022   LDLCALC 46 02/12/2022   TRIG 186.0 (H) 02/12/2022   CHOLHDL 2 02/12/2022   Concerns today:   Shortness of breath:  Patient was seen here by Dr. Johnny on 1/13 for SOB and chest tightness. Denies chest  pain or palpitations when he was having the SOB.  Symptoms have resolved. He was prescribed Lexapro  10 mg at night.   With the medication, he is having vivid dreams, and feels like the night is short. No GI symptoms.   Review of Systems  Constitutional:  Negative for activity change, appetite change and fever.  HENT:  Negative for nosebleeds, sore throat and trouble swallowing.   Eyes:  Negative for redness and visual disturbance.  Respiratory:  Negative for cough, shortness of breath and wheezing.   Cardiovascular:  Negative for chest pain, palpitations and leg swelling.  Gastrointestinal:  Negative for abdominal pain, blood in stool, nausea and vomiting.  Endocrine: Negative for cold intolerance, heat intolerance, polydipsia, polyphagia and polyuria.  Genitourinary:  Negative for decreased urine volume, dysuria, genital sores, hematuria and testicular pain.  Musculoskeletal:  Negative for gait problem and myalgias.  Skin:  Negative for color change and rash.  Allergic/Immunologic: Positive for environmental allergies.  Neurological:  Negative for syncope, weakness and headaches.  Hematological:  Negative for adenopathy. Does not bruise/bleed easily.  Psychiatric/Behavioral:  Negative for confusion and hallucinations.   All other systems reviewed and are negative.  Current Outpatient Medications on File Prior to Visit  Medication Sig Dispense Refill   Acetaminophen  (TYLENOL  PO) Take 1 tablet by mouth as needed (pain).     aspirin  EC 81 MG tablet Take 1 tablet (81 mg total) by mouth daily. Swallow whole. 90 tablet 3   diphenhydrAMINE (  BENADRYL) 25 MG tablet Take 25 mg by mouth daily.     loratadine (CLARITIN) 10 MG tablet Take 10 mg by mouth daily as needed for allergies.     rosuvastatin  (CRESTOR ) 10 MG tablet Take 1 tablet (10 mg total) by mouth daily. 90 tablet 3   No current facility-administered medications on file prior to visit.   Past Medical History:  Diagnosis Date    Allergy    seasonal   ARTHRALGIA 03/07/2010   Qualifier: Diagnosis of  By: Eyvonne MD, Debby ORN    Arthritis    'mild   Chest tightness 06/27/2020   Chicken pox    Coronary artery disease 09/05/2020   DOE (dyspnea on exertion) 06/27/2020   Headache    Hyperlipidemia    Left bundle branch block    Left bundle branch block (LBBB) 12/29/2016   Mixed dyslipidemia 10/28/2019   Non-traumatic mid back pain 04/24/2016   Nonsustained ventricular tachycardia (HCC) 09/05/2020   Syncope 11/28/2016   Syncope and collapse 09/17/2020   Third degree heart block (HCC)    Allergies  Allergen Reactions   Cefdinir Rash    Rash groin Mood    Social History   Socioeconomic History   Marital status: Married    Spouse name: Not on file   Number of children: 1   Years of education: PhD   Highest education level: Not on file  Occupational History   Occupation: professor of Systems Analyst: [employer]  Tobacco Use   Smoking status: Never   Smokeless tobacco: Never  Vaping Use   Vaping status: Never Used  Substance and Sexual Activity   Alcohol use: Yes    Comment: 3 - 4 times per week    Drug use: No   Sexual activity: Not on file  Other Topics Concern   Not on file  Social History Narrative   Lives with wife and child in a one story home.  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  Education: Phd   Social Drivers of Corporate Investment Banker Strain: Not on file  Food Insecurity: Not on file  Transportation Needs: Not on file  Physical Activity: Not on file  Stress: Not on file  Social Connections: Not on file   Vitals:   03/24/23 0808  BP: 122/80  Pulse: 100  Resp: 12  SpO2: 95%   Body mass index is 32.8 kg/m. Wt Readings from Last 3 Encounters:  03/24/23 222 lb 2 oz (100.8 kg)  03/02/23 223 lb (101.2 kg)  08/05/22 212 lb (96.2 kg)   Physical Exam Vitals and nursing note reviewed.  Constitutional:      General: He is not in acute distress.     Appearance: He is well-developed.  HENT:     Head: Normocephalic and atraumatic.     Right Ear: External ear normal.     Left Ear: Tympanic membrane, ear canal and external ear normal.     Ears:     Comments: Excess cerumen in right ear canal, could not see TM.    Mouth/Throat:     Mouth: Mucous membranes are moist.     Pharynx: Oropharynx is clear. Uvula midline.  Eyes:     Extraocular Movements: Extraocular movements intact.     Conjunctiva/sclera: Conjunctivae normal.     Pupils: Pupils are equal, round, and reactive to light.  Neck:     Thyroid : No thyromegaly.  Cardiovascular:     Rate and Rhythm: Normal  rate and regular rhythm.     Pulses:          Dorsalis pedis pulses are 2+ on the right side and 2+ on the left side.     Heart sounds: No murmur heard. Pulmonary:     Effort: Pulmonary effort is normal. No respiratory distress.     Breath sounds: Normal breath sounds.  Abdominal:     Palpations: Abdomen is soft. There is no hepatomegaly or mass.     Tenderness: There is no abdominal tenderness.  Genitourinary:    Comments: No concerns. Musculoskeletal:        General: No tenderness.     Cervical back: Normal range of motion.     Right lower leg: No edema.     Left lower leg: No edema.     Comments: No signs of synovitis.  Lymphadenopathy:     Cervical: No cervical adenopathy.     Upper Body:     Right upper body: No supraclavicular adenopathy.     Left upper body: No supraclavicular adenopathy.  Skin:    General: Skin is warm.     Findings: No erythema.  Neurological:     General: No focal deficit present.     Mental Status: He is alert and oriented to person, place, and time.     Cranial Nerves: No cranial nerve deficit.     Sensory: No sensory deficit.     Motor: No weakness.     Gait: Gait normal.     Deep Tendon Reflexes:     Reflex Scores:      Bicep reflexes are 2+ on the right side and 2+ on the left side.      Patellar reflexes are 2+ on the right  side and 2+ on the left side. Psychiatric:        Mood and Affect: Mood and affect normal.   ASSESSMENT AND PLAN:  Mr. Longest was seen today for a physical and to follow on shortness of breath.   Orders Placed This Encounter  Procedures   Flu vaccine trivalent PF, 6mos and older(Flulaval,Afluria,Fluarix,Fluzone)   PSA   Routine general medical examination at a health care facility Assessment & Plan: We discussed the importance of regular physical activity and healthy diet for prevention of chronic illness and/or complications. Preventive guidelines reviewed. Vaccination: Flu vaccine given today, we do not have Prevnar 20 at this time. Next CPE in a year.   Prostate cancer screening -     PSA; Future  Anxiety disorder, unspecified type Assessment & Plan: Started on Lexapro  10 mg about 3 weeks ago, he has noted improvement of symptoms. Continue Lexapro  same dose, instructed to let me know in about 4 weeks if he still feels like medication is helping, new prescription sent. Recommend taking medication in the morning instead at night. Follow-up in a year, before if needed.  Orders: -     Escitalopram  Oxalate; Take 1 tablet (10 mg total) by mouth daily.  Dispense: 90 tablet; Refill: 2  Need for influenza vaccination -     Flu vaccine trivalent PF, 6mos and older(Flulaval,Afluria,Fluarix,Fluzone)  Presence of heart assist device (HCC) S/P pacemaker placement.  Transient complete heart block (HCC) S/P pacemaker placement in 09/2020. Follows with cardiologist anually.  Planning on having fasting labs ordered by his cardiologist, PSA added to next blood work.  Return in 1 year (on 03/23/2024) for CPE.  I, Leonce PARAS Wierda, acting as a scribe for Jilberto Vanderwall, MD., have documented  all relevant documentation on the behalf of [place], MD, as directed by  Fallan Mccarey, MD while in the presence of Jontae Adebayo, MD.   I, Faria Casella, MD, have reviewed all documentation for this  visit. The documentation on 03/24/23 for the exam, diagnosis, procedures, and orders are all accurate and complete.  Natajah Derderian G. Azaylea Maves, MD  [facility]. [place] office.

## 2023-03-24 ENCOUNTER — Encounter: Payer: Self-pay | Admitting: Family Medicine

## 2023-03-24 ENCOUNTER — Ambulatory Visit (INDEPENDENT_AMBULATORY_CARE_PROVIDER_SITE_OTHER): Payer: 59 | Admitting: Family Medicine

## 2023-03-24 VITALS — BP 122/80 | HR 100 | Resp 12 | Ht 69.0 in | Wt 222.1 lb

## 2023-03-24 DIAGNOSIS — Z95811 Presence of heart assist device: Secondary | ICD-10-CM | POA: Insufficient documentation

## 2023-03-24 DIAGNOSIS — Z23 Encounter for immunization: Secondary | ICD-10-CM | POA: Diagnosis not present

## 2023-03-24 DIAGNOSIS — Z125 Encounter for screening for malignant neoplasm of prostate: Secondary | ICD-10-CM

## 2023-03-24 DIAGNOSIS — I442 Atrioventricular block, complete: Secondary | ICD-10-CM

## 2023-03-24 DIAGNOSIS — F419 Anxiety disorder, unspecified: Secondary | ICD-10-CM | POA: Insufficient documentation

## 2023-03-24 DIAGNOSIS — Z0000 Encounter for general adult medical examination without abnormal findings: Secondary | ICD-10-CM

## 2023-03-24 MED ORDER — ESCITALOPRAM OXALATE 10 MG PO TABS: 10.0000 mg | ORAL_TABLET | Freq: Every day | ORAL | 2 refills | Status: DC

## 2023-03-24 NOTE — Patient Instructions (Addendum)
 A few things to remember from today's visit:  Routine general medical examination at a health care facility  Prostate cancer screening - Plan: PSA  Anxiety disorder, unspecified type - Plan: escitalopram  (LEXAPRO ) 10 MG tablet Please let me n=know in about 4 weeks if Lexapro  is helping.  If you need refills for medications you take chronically, please call your pharmacy. Do not use My Chart to request refills or for acute issues that need immediate attention. If you send a my chart message, it may take a few days to be addressed, specially if I am not in the office.  Please be sure medication list is accurate. If a new problem present, please set up appointment sooner than planned today.  Health Maintenance, Male Adopting a healthy lifestyle and getting preventive care are important in promoting health and wellness. Ask your health care provider about: The right schedule for you to have regular tests and exams. Things you can do on your own to prevent diseases and keep yourself healthy. What should I know about diet, weight, and exercise? Eat a healthy diet  Eat a diet that includes plenty of vegetables, fruits, low-fat dairy products, and lean protein. Do not eat a lot of foods that are high in solid fats, added sugars, or sodium. Maintain a healthy weight Body mass index (BMI) is a measurement that can be used to identify possible weight problems. It estimates body fat based on height and weight. Your health care provider can help determine your BMI and help you achieve or maintain a healthy weight. Get regular exercise Get regular exercise. This is one of the most important things you can do for your health. Most adults should: Exercise for at least 150 minutes each week. The exercise should increase your heart rate and make you sweat (moderate-intensity exercise). Do strengthening exercises at least twice a week. This is in addition to the moderate-intensity exercise. Spend less  time sitting. Even light physical activity can be beneficial. Watch cholesterol and blood lipids Have your blood tested for lipids and cholesterol at 52 years of age, then have this test every 5 years. You may need to have your cholesterol levels checked more often if: Your lipid or cholesterol levels are high. You are older than 52 years of age. You are at high risk for heart disease. What should I know about cancer screening? Many types of cancers can be detected early and may often be prevented. Depending on your health history and family history, you may need to have cancer screening at various ages. This may include screening for: Colorectal cancer. Prostate cancer. Skin cancer. Lung cancer. What should I know about heart disease, diabetes, and high blood pressure? Blood pressure and heart disease High blood pressure causes heart disease and increases the risk of stroke. This is more likely to develop in people who have high blood pressure readings or are overweight. Talk with your health care provider about your target blood pressure readings. Have your blood pressure checked: Every 3-5 years if you are 68-79 years of age. Every year if you are 26 years old or older. If you are between the ages of 42 and 48 and are a current or former smoker, ask your health care provider if you should have a one-time screening for abdominal aortic aneurysm (AAA). Diabetes Have regular diabetes screenings. This checks your fasting blood sugar level. Have the screening done: Once every three years after age 20 if you are at a normal weight and have a  low risk for diabetes. More often and at a younger age if you are overweight or have a high risk for diabetes. What should I know about preventing infection? Hepatitis B If you have a higher risk for hepatitis B, you should be screened for this virus. Talk with your health care provider to find out if you are at risk for hepatitis B infection. Hepatitis  C Blood testing is recommended for: Everyone born from [birth year] through 1964. Anyone with known risk factors for hepatitis C. Sexually transmitted infections (STIs) You should be screened each year for STIs, including gonorrhea and chlamydia, if: You are sexually active and are younger than 52 years of age. You are older than 52 years of age and your health care provider tells you that you are at risk for this type of infection. Your sexual activity has changed since you were last screened, and you are at increased risk for chlamydia or gonorrhea. Ask your health care provider if you are at risk. Ask your health care provider about whether you are at high risk for HIV. Your health care provider may recommend a prescription medicine to help prevent HIV infection. If you choose to take medicine to prevent HIV, you should first get tested for HIV. You should then be tested every 3 months for as long as you are taking the medicine. Follow these instructions at home: Alcohol use Do not drink alcohol if your health care provider tells you not to drink. If you drink alcohol: Limit how much you have to 0-2 drinks a day. Know how much alcohol is in your drink. In the [place] one drink equals one 12 oz bottle of beer (355 mL), one 5 oz glass of wine (148 mL), or one 1 oz glass of hard liquor (44 mL). Lifestyle Do not use any products that contain nicotine or tobacco. These products include cigarettes, chewing tobacco, and vaping devices, such as e-cigarettes. If you need help quitting, ask your health care provider. Do not use street drugs. Do not share needles. Ask your health care provider for help if you need support or information about quitting drugs. General instructions Schedule regular health, dental, and eye exams. Stay current with your vaccines. Tell your health care provider if: You often feel depressed. You have ever been abused or do not feel safe at home. Summary Adopting a healthy  lifestyle and getting preventive care are important in promoting health and wellness. Follow your health care provider's instructions about healthy diet, exercising, and getting tested or screened for diseases. Follow your health care provider's instructions on monitoring your cholesterol and blood pressure. This information is not intended to replace advice given to you by your health care provider. Make sure you discuss any questions you have with your health care provider. Document Revised: 06/25/2020 Document Reviewed: 06/25/2020 Elsevier Patient Education  2024 [employer].

## 2023-03-24 NOTE — Assessment & Plan Note (Addendum)
 Started on Lexapro  10 mg about 3 weeks ago, he has noted improvement of symptoms. Continue Lexapro  same dose, instructed to let me know in about 4 weeks if he still feels like medication is helping, new prescription sent. Recommend taking medication in the morning instead at night. Follow-up in a year, before if needed.

## 2023-03-24 NOTE — Assessment & Plan Note (Signed)
 We discussed the importance of regular physical activity and healthy diet for prevention of chronic illness and/or complications. Preventive guidelines reviewed. Vaccination: Flu vaccine given today, we do not have Prevnar 20 at this time. Next CPE in a year.

## 2023-03-26 LAB — HEPATIC FUNCTION PANEL
ALT: 19 [IU]/L (ref 0–44)
AST: 21 [IU]/L (ref 0–40)
Albumin: 4.3 g/dL (ref 3.8–4.9)
Alkaline Phosphatase: 66 [IU]/L (ref 44–121)
Bilirubin Total: 0.7 mg/dL (ref 0.0–1.2)
Bilirubin, Direct: 0.25 mg/dL (ref 0.00–0.40)
Total Protein: 7 g/dL (ref 6.0–8.5)

## 2023-03-26 LAB — LIPID PANEL
Chol/HDL Ratio: 2.6 {ratio} (ref 0.0–5.0)
Cholesterol, Total: 126 mg/dL (ref 100–199)
HDL: 49 mg/dL (ref >39)
LDL Chol Calc (NIH): 55 mg/dL (ref 0–99)
Triglycerides: 122 mg/dL (ref 0–149)
VLDL Cholesterol Cal: 22 mg/dL (ref 5–40)

## 2023-03-31 ENCOUNTER — Encounter: Payer: Self-pay | Admitting: Cardiovascular Disease

## 2023-04-28 NOTE — Addendum Note (Signed)
 Addended by: Elease Etienne A on: 04/28/2023 09:24 AM   Modules accepted: Orders

## 2023-04-28 NOTE — Progress Notes (Signed)
 Remote pacemaker transmission.

## 2023-06-17 ENCOUNTER — Ambulatory Visit (INDEPENDENT_AMBULATORY_CARE_PROVIDER_SITE_OTHER): Payer: BC Managed Care – PPO

## 2023-06-17 DIAGNOSIS — I447 Left bundle-branch block, unspecified: Secondary | ICD-10-CM | POA: Diagnosis not present

## 2023-06-19 LAB — CUP PACEART REMOTE DEVICE CHECK
Battery Remaining Longevity: 146 mo
Battery Voltage: 3.05 V
Brady Statistic AP VP Percent: 0.01 %
Brady Statistic AP VS Percent: 6.42 %
Brady Statistic AS VP Percent: 0.03 %
Brady Statistic AS VS Percent: 93.54 %
Brady Statistic RA Percent Paced: 6.43 %
Brady Statistic RV Percent Paced: 0.04 %
Date Time Interrogation Session: 20250501211112
Implantable Lead Connection Status: 753985
Implantable Lead Connection Status: 753985
Implantable Lead Implant Date: 20220802
Implantable Lead Implant Date: 20220802
Implantable Lead Location: 753859
Implantable Lead Location: 753860
Implantable Lead Model: 3830
Implantable Lead Model: 5076
Implantable Pulse Generator Implant Date: 20220802
Lead Channel Impedance Value: 361 Ohm
Lead Channel Impedance Value: 399 Ohm
Lead Channel Impedance Value: 475 Ohm
Lead Channel Impedance Value: 532 Ohm
Lead Channel Pacing Threshold Amplitude: 0.625 V
Lead Channel Pacing Threshold Amplitude: 1.25 V
Lead Channel Pacing Threshold Pulse Width: 0.4 ms
Lead Channel Pacing Threshold Pulse Width: 0.4 ms
Lead Channel Sensing Intrinsic Amplitude: 2 mV
Lead Channel Sensing Intrinsic Amplitude: 2 mV
Lead Channel Sensing Intrinsic Amplitude: 23.75 mV
Lead Channel Sensing Intrinsic Amplitude: 23.75 mV
Lead Channel Setting Pacing Amplitude: 1.5 V
Lead Channel Setting Pacing Amplitude: 2.5 V
Lead Channel Setting Pacing Pulse Width: 0.4 ms
Lead Channel Setting Sensing Sensitivity: 2.8 mV
Zone Setting Status: 755011

## 2023-06-29 ENCOUNTER — Encounter: Payer: Self-pay | Admitting: Cardiovascular Disease

## 2023-07-29 ENCOUNTER — Encounter: Payer: Self-pay | Admitting: Cardiovascular Disease

## 2023-07-29 ENCOUNTER — Ambulatory Visit: Attending: Cardiovascular Disease | Admitting: Cardiovascular Disease

## 2023-07-29 VITALS — BP 118/64 | HR 90 | Ht 69.0 in | Wt 219.4 lb

## 2023-07-29 DIAGNOSIS — I10 Essential (primary) hypertension: Secondary | ICD-10-CM

## 2023-07-29 DIAGNOSIS — Z950 Presence of cardiac pacemaker: Secondary | ICD-10-CM

## 2023-07-29 NOTE — Progress Notes (Signed)
  Electrophysiology Office Note:    Date:  07/29/2023   ID:  Joel Lynch, DOB [birth year], MRN [id number]  PCP:  [place], [name], MD   [facility] Providers Cardiologist:  Kyra Phy, MD Electrophysiologist:  Efraim Grange, MD     Referring MD: [place], [name], MD   History of Present Illness:    Joel Lynch is a 52 y.o. male with a medical history significant for complete heart block, who presents for EP follow-up.      Discussed the use of AI scribe software for clinical note transcription with the patient, who gave verbal consent to proceed.  History of Present Illness Joel Lynch is a 53 year old male with complete heart block and syncope who presents for follow-up regarding his Medtronic pacemaker.  He has a history of complete heart block and syncope, which led to the implantation of a Medtronic pacemaker in August 2022. Prior to the pacemaker, he experienced episodes of syncope due to the heart block. Since the implantation, there have been no further episodes of syncope.  He experiences episodes a few times a year where he feels 'really bad,' particularly in the heat, although he is unsure of the exact frequency. On May 09, 2023, an episode of increased heart rate lasting 49 seconds was recorded, but he w AV blockas unaware of this event and it did not cause any symptoms.  He is aware of the pacemaker, especially during physical activities like weightlifting, and experiences some discomfort. He is cautious about exercise, particularly movements involving the upper body, due to concerns about the pacemaker leads. He recalls a recent episode of heat exhaustion that required recovery over the weekend, though he is unsure if it is related to his heart condition.         Today, he reports that he is at baseline and has no complaints. he has no device related complaints -- no new tenderness, drainage, redness.   EKGs/Labs/Other Studies Reviewed  Today:      Advanced imaging:  Cardiac MRI EF 51%. No LGE. Mild asymmetric LVH.    EKG:   EKG Interpretation Date/Time:  Wednesday July 29 2023 15:32:18 EDT Ventricular Rate:  90 PR Interval:  164 QRS Duration:  134 QT Interval:  400 QTC Calculation: 489 R Axis:   -12  Text Interpretation: Normal sinus rhythm Left bundle branch block When compared with ECG of 19-Sep-2020 05:00, Sinus rhythm has replaced Electronic ventricular pacemaker Confirmed by Marlane Silver [phone]) on 07/29/2023 3:49:52 PM     Physical Exam:    VS:  BP 118/64   Pulse 90   Ht 5' 9 (1.753 m)   Wt 219 lb 6.4 oz (99.5 kg)   SpO2 96%   BMI 32.40 kg/m     Wt Readings from Last 3 Encounters:  07/29/23 219 lb 6.4 oz (99.5 kg)  03/24/23 222 lb 2 oz (100.8 kg)  03/02/23 223 lb (101.2 kg)     GEN: Well nourished, well developed in no acute distress CARDIAC: RRR, no murmurs, rubs, gallops RESPIRATORY:  Normal work of breathing MUSCULOSKELETAL: no edema    ASSESSMENT & PLAN:     Paroxysmal AV block Medtronic dual-chamber pacemaker in place, functioning normally I reviewed today's interrogation.  See Paceart for details He is not pacemaker dependent      Signed, Efraim Grange, MD  07/29/2023 4:12 PM    [facility]

## 2023-07-29 NOTE — Patient Instructions (Signed)
 Medication Instructions:  Your physician recommends that you continue on your current medications as directed. Please refer to the Current Medication list given to you today. *If you need a refill on your cardiac medications before your next appointment, please call your pharmacy*   Follow-Up: At [facility], you and your health needs are our priority.  As part of our continuing mission to provide you with exceptional heart care, our providers are all part of one team.  This team includes your primary Cardiologist (physician) and Advanced Practice Providers or APPs (Physician Assistants and Nurse Practitioners) who all work together to provide you with the care you need, when you need it.  Your next appointment:   2 year(s)  Provider:   Marlane Silver, MD

## 2023-07-30 NOTE — Progress Notes (Signed)
 Remote pacemaker transmission.

## 2023-07-30 NOTE — Addendum Note (Signed)
 Addended by: Lott Rouleau A on: 07/30/2023 03:02 PM   Modules accepted: Orders

## 2023-09-16 ENCOUNTER — Ambulatory Visit (INDEPENDENT_AMBULATORY_CARE_PROVIDER_SITE_OTHER): Payer: BC Managed Care – PPO

## 2023-09-16 DIAGNOSIS — I447 Left bundle-branch block, unspecified: Secondary | ICD-10-CM | POA: Diagnosis not present

## 2023-09-21 LAB — CUP PACEART REMOTE DEVICE CHECK
Battery Remaining Longevity: 142 mo
Battery Voltage: 3.05 V
Brady Statistic AP VP Percent: 0.01 %
Brady Statistic AP VS Percent: 8.6 %
Brady Statistic AS VP Percent: 0.03 %
Brady Statistic AS VS Percent: 91.36 %
Brady Statistic RA Percent Paced: 8.62 %
Brady Statistic RV Percent Paced: 0.04 %
Date Time Interrogation Session: 20250801161815
Implantable Lead Connection Status: 753985
Implantable Lead Connection Status: 753985
Implantable Lead Implant Date: 20220802
Implantable Lead Implant Date: 20220802
Implantable Lead Location: 753859
Implantable Lead Location: 753860
Implantable Lead Model: 3830
Implantable Lead Model: 5076
Implantable Pulse Generator Implant Date: 20220802
Lead Channel Impedance Value: 342 Ohm
Lead Channel Impedance Value: 361 Ohm
Lead Channel Impedance Value: 437 Ohm
Lead Channel Impedance Value: 513 Ohm
Lead Channel Pacing Threshold Amplitude: 0.5 V
Lead Channel Pacing Threshold Amplitude: 1.375 V
Lead Channel Pacing Threshold Pulse Width: 0.4 ms
Lead Channel Pacing Threshold Pulse Width: 0.4 ms
Lead Channel Sensing Intrinsic Amplitude: 2.25 mV
Lead Channel Sensing Intrinsic Amplitude: 2.25 mV
Lead Channel Sensing Intrinsic Amplitude: 22.375 mV
Lead Channel Sensing Intrinsic Amplitude: 22.375 mV
Lead Channel Setting Pacing Amplitude: 1.5 V
Lead Channel Setting Pacing Amplitude: 2.75 V
Lead Channel Setting Pacing Pulse Width: 0.4 ms
Lead Channel Setting Sensing Sensitivity: 2.8 mV
Zone Setting Status: 755011

## 2023-09-29 ENCOUNTER — Ambulatory Visit: Payer: Self-pay | Admitting: Cardiovascular Disease

## 2023-10-02 ENCOUNTER — Other Ambulatory Visit: Payer: Self-pay | Admitting: Nurse Practitioner

## 2023-11-06 NOTE — Progress Notes (Signed)
 Cardiology Office Note:   Date:  11/09/2023  ID:  Joel Lynch, DOB August 29, 1970, MRN [id number] PCP:  [place], [name], MD  [facility] Providers Cardiologist:  Wendel Haws, MD Referring MD: [place], [name], MD  Chief Complaint/Reason for Referral: Follow-up CAD ASSESSMENT:    1. Coronary artery disease involving native coronary artery of native heart without angina pectoris   2. Hyperlipidemia LDL goal <70   3. Presence of permanent cardiac pacemaker   4. BMI 29.0-29.9,adult     PLAN:   In order of problems listed above: CAD: Continue aspirin  81 mg, rosuvastatin  10 mg Hyperlipidemia: Continue rosuvastatin  10 mg.  LDL in February was at goal of 55.  Check LP(a) today. Status post pacemaker: Followed by EP. Elevated BMI: Diet and exercise modification            Dispo:  Return in about 1 year (around 11/08/2024).       I spent 33 minutes reviewing all clinical data during and prior to this visit including all relevant imaging studies, laboratories, clinical information from other health systems and prior notes from both Cardiology and other specialties, interviewing the patient, conducting a complete physical examination, and coordinating care in order to formulate a comprehensive and personalized evaluation and treatment plan.   History of Present Illness:    FOCUSED PROBLEM LIST:   Mild CAD Coronary CTA 2022 Hyperlipidemia High-grade heart block and syncope 2022 Status post PPM No LGE CMR 2022 LBBB BMI 15 Jul 2021: Continues to do well with no significant cardiovascular complaints.  Actually feels like he has more energy and focus now.  He did have an urgent care visit for bronchitis but no emergency room visits for any other significant issues.  Plan: Follow-up in 1 year  September 2025:  Patient consents to use of AI scribe. Patient returns for routine follow-up.  He was last seen in general cardiology clinic in June that point in time he was doing well  without cardiovascular complaints work stress due to being promoted at [employer].  No changes were made to his medical regimen.  His blood pressure was well-controlled.  He was seen in June of this year by the EP service.  His pacemaker was working well.  No current chest pain or shortness of breath. No issues with his current medications.  He has not experienced any recent hospitalizations or emergency room visits. He last saw his electrophysiology doctors one to two months ago and is continuing to transmit data from his pacemaker.  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     Current Medications: Current Meds  Medication Sig   Acetaminophen  (TYLENOL  PO) Take 1 tablet by mouth as needed (pain).   aspirin  EC 81 MG tablet Take 1 tablet (81 mg total) by mouth daily. Swallow whole.   diphenhydrAMINE (BENADRYL) 25 MG tablet Take 25 mg by mouth daily.   escitalopram  (LEXAPRO ) 10 MG tablet Take 1 tablet (10 mg total) by mouth daily.   loratadine (CLARITIN) 10 MG tablet Take 10 mg by mouth daily as needed for allergies.   rosuvastatin  (CRESTOR ) 10 MG tablet TAKE 1 TABLET BY MOUTH EVERY DAY     Review of Systems:   Please see the history of present illness.    All other systems reviewed and are negative.     EKGs/Labs/Other Test Reviewed:   EKG: 2025 normal sinus rhythm with left bundle branch block  EKG Interpretation  Date/Time:    Ventricular Rate:    PR Interval:    QRS Duration:    QT Interval:    QTC Calculation:   R Axis:      Text Interpretation:          CARDIAC STUDIES: Refer to CV Procedures and Imaging Tabs   Risk Assessment/Calculations:          Physical Exam:   VS:  BP 113/76   Pulse 81   Ht 5' 9 (1.753 m)   Wt 215 lb (97.5 kg)   SpO2 95%   BMI 31.75 kg/m        Wt Readings from Last 3 Encounters:  11/09/23 215 lb (97.5 kg)  07/29/23 219 lb 6.4 oz (99.5 kg)  03/24/23 222 lb 2 oz  (100.8 kg)      GENERAL:  No apparent distress, AOx3 HEENT:  No carotid bruits, +2 carotid impulses, no scleral icterus CAR: RRR no murmurs, gallops, rubs, or thrills RES:  Clear to auscultation bilaterally ABD:  Soft, nontender, nondistended, positive bowel sounds x 4 VASC:  +2 radial pulses, +2 carotid pulses NEURO:  CN 2-12 grossly intact; motor and sensory grossly intact PSYCH:  No active depression or anxiety EXT:  No edema, ecchymosis, or cyanosis  Signed, Donyae Kohn K Mizael Sagar, MD  11/09/2023 8:42 AM    [facility] [address] [place], [place], [place]  [address] Phone: [phone]; Fax: [phone]   Note:  This document was prepared using Dragon voice recognition software and may include unintentional dictation errors.

## 2023-11-09 ENCOUNTER — Ambulatory Visit: Attending: Internal Medicine | Admitting: Internal Medicine

## 2023-11-09 ENCOUNTER — Encounter: Payer: Self-pay | Admitting: Internal Medicine

## 2023-11-09 ENCOUNTER — Other Ambulatory Visit: Payer: Self-pay

## 2023-11-09 VITALS — BP 113/76 | HR 81 | Ht 69.0 in | Wt 215.0 lb

## 2023-11-09 DIAGNOSIS — Z950 Presence of cardiac pacemaker: Secondary | ICD-10-CM

## 2023-11-09 DIAGNOSIS — Z6829 Body mass index (BMI) 29.0-29.9, adult: Secondary | ICD-10-CM | POA: Diagnosis not present

## 2023-11-09 DIAGNOSIS — I2510 Atherosclerotic heart disease of native coronary artery without angina pectoris: Secondary | ICD-10-CM

## 2023-11-09 DIAGNOSIS — E785 Hyperlipidemia, unspecified: Secondary | ICD-10-CM | POA: Diagnosis not present

## 2023-11-09 NOTE — Patient Instructions (Signed)
 Medication Instructions:  Your physician recommends that you continue on your current medications as directed. Please refer to the Current Medication list given to you today.  *If you need a refill on your cardiac medications before your next appointment, please call your pharmacy*  Lab Work: TODAY: LpA  Follow-Up: At [facility], you and your health needs are our priority.  As part of our continuing mission to provide you with exceptional heart care, our providers are all part of one team.  This team includes your primary Cardiologist (physician) and Advanced Practice Providers or APPs (Physician Assistants and Nurse Practitioners) who all work together to provide you with the care you need, when you need it.  Your next appointment:   1 year  Provider:   Glendia Ferrier, PA-C

## 2023-11-11 ENCOUNTER — Ambulatory Visit: Payer: Self-pay | Admitting: Internal Medicine

## 2023-11-11 LAB — LIPOPROTEIN A (LPA): Lipoprotein (a): <8.4 nmol/L (ref <75.0)

## 2023-11-18 NOTE — Progress Notes (Signed)
 Remote PPM Transmission

## 2023-12-30 ENCOUNTER — Ambulatory Visit: Attending: Cardiovascular Disease

## 2023-12-30 DIAGNOSIS — I2510 Atherosclerotic heart disease of native coronary artery without angina pectoris: Secondary | ICD-10-CM | POA: Diagnosis not present

## 2023-12-31 LAB — CUP PACEART REMOTE DEVICE CHECK
Battery Remaining Longevity: 139 mo
Battery Voltage: 3.04 V
Brady Statistic AP VP Percent: 0.01 %
Brady Statistic AP VS Percent: 4.38 %
Brady Statistic AS VP Percent: 0.03 %
Brady Statistic AS VS Percent: 95.58 %
Brady Statistic RA Percent Paced: 4.39 %
Brady Statistic RV Percent Paced: 0.04 %
Date Time Interrogation Session: 20251112103015
Implantable Lead Connection Status: 753985
Implantable Lead Connection Status: 753985
Implantable Lead Implant Date: 20220802
Implantable Lead Implant Date: 20220802
Implantable Lead Location: 753859
Implantable Lead Location: 753860
Implantable Lead Model: 3830
Implantable Lead Model: 5076
Implantable Pulse Generator Implant Date: 20220802
Lead Channel Impedance Value: 342 Ohm
Lead Channel Impedance Value: 399 Ohm
Lead Channel Impedance Value: 475 Ohm
Lead Channel Impedance Value: 494 Ohm
Lead Channel Pacing Threshold Amplitude: 0.5 V
Lead Channel Pacing Threshold Amplitude: 1.375 V
Lead Channel Pacing Threshold Pulse Width: 0.4 ms
Lead Channel Pacing Threshold Pulse Width: 0.4 ms
Lead Channel Sensing Intrinsic Amplitude: 27.875 mV
Lead Channel Sensing Intrinsic Amplitude: 27.875 mV
Lead Channel Sensing Intrinsic Amplitude: 3 mV
Lead Channel Sensing Intrinsic Amplitude: 3 mV
Lead Channel Setting Pacing Amplitude: 1.5 V
Lead Channel Setting Pacing Amplitude: 2.75 V
Lead Channel Setting Pacing Pulse Width: 0.4 ms
Lead Channel Setting Sensing Sensitivity: 2.8 mV
Zone Setting Status: 755011

## 2024-01-05 NOTE — Progress Notes (Signed)
 Remote PPM Transmission

## 2024-01-18 ENCOUNTER — Ambulatory Visit: Payer: Self-pay | Admitting: Cardiovascular Disease

## 2024-01-23 ENCOUNTER — Other Ambulatory Visit: Payer: Self-pay | Admitting: Family Medicine

## 2024-01-23 DIAGNOSIS — F419 Anxiety disorder, unspecified: Secondary | ICD-10-CM

## 2024-02-01 ENCOUNTER — Encounter: Payer: Self-pay | Admitting: Family Medicine

## 2024-02-01 ENCOUNTER — Ambulatory Visit: Payer: 59 | Admitting: Family Medicine

## 2024-02-01 VITALS — BP 116/84 | HR 71 | Temp 98.7°F | Resp 16 | Ht 69.0 in | Wt 225.0 lb

## 2024-02-01 DIAGNOSIS — Z23 Encounter for immunization: Secondary | ICD-10-CM

## 2024-02-01 DIAGNOSIS — E782 Mixed hyperlipidemia: Secondary | ICD-10-CM

## 2024-02-01 DIAGNOSIS — R21 Rash and other nonspecific skin eruption: Secondary | ICD-10-CM

## 2024-02-01 DIAGNOSIS — L304 Erythema intertrigo: Secondary | ICD-10-CM

## 2024-02-01 DIAGNOSIS — T7819XA Other adverse food reactions, not elsewhere classified, initial encounter: Secondary | ICD-10-CM

## 2024-02-01 DIAGNOSIS — Z0000 Encounter for general adult medical examination without abnormal findings: Secondary | ICD-10-CM

## 2024-02-01 MED ORDER — NYSTATIN-TRIAMCINOLONE 100000-0.1 UNIT/GM-% EX CREA: 1.0000 | TOPICAL_CREAM | Freq: Two times a day (BID) | CUTANEOUS | 0 refills | Status: AC | PRN

## 2024-02-01 MED ORDER — EPINEPHRINE 0.3 MG/0.3ML IJ SOAJ: 0.3000 mg | INTRAMUSCULAR | 1 refills | Status: AC | PRN

## 2024-02-01 MED ORDER — NYSTATIN 100000 UNIT/GM EX POWD: 1.0000 | Freq: Two times a day (BID) | CUTANEOUS | 5 refills | Status: AC

## 2024-02-01 NOTE — Progress Notes (Signed)
 Chief Complaint  Patient presents with   Medical Management of Chronic Issues    Past several months a new skin sensitivity to liquid soaps, laundry detergent and fish his hands will break out. States his butt and crotch will really flare up.    Discussed the use of AI scribe software for clinical note transcription with the patient, who gave verbal consent to proceed.  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 He is not sure if these are all related.  For the past couple of years, he has experienced skin sensitivity and rashes on different areas of his body. Generalized, mildly pruritic skin rash that seems to be triggered by liquid bath soaps, laundry detergents he uses when he is not at home, from hotels. He thinks he started this problem after being treated with systemic steroids for a respiratory condition. Has not used OTC treatments. No history of eczema or similar rash.  -Erythema on his hands after handling fish is described as feeling like he has been 'dipped in acid'.  He has been fishing for many years and never had this problem before. When he handles fish immediately after handling fish he has very mild symptoms. If he forgets to wash his hands, the symptoms can last 15 to 30 minutes. No associated sore throat, cough, wheezing, SOB, or stridor. He has not tried OTC medications. He does not have any problem when he is handling fish when cooking or when eating it.   He also has a rash located in the inguinal and buttock area, pruritic. He has tried using Gold Bond powder and moisturizing lotions without relief. He also reports rectal bleeding, which he notices on toilet paper after bowel movements, particularly when he has itching in the area. He does not see blood in the stool itself. He  had a colonoscopy in 2023. He is not sure if blood is frpom skin he has scratch. No changes in bowel habits.  Review of Systems  Constitutional:  Negative for activity change, appetite change, chills and fever.  HENT:  Negative for mouth sores and trouble swallowing.   Cardiovascular:  Negative for chest pain and leg swelling.  Gastrointestinal:  Negative for abdominal pain, nausea and vomiting.  Genitourinary:  Negative for decreased urine volume, dysuria and hematuria.  Musculoskeletal:  Negative for joint swelling and myalgias.  Allergic/Immunologic: Positive for environmental allergies.  Neurological:  Negative for syncope, weakness and numbness.  Psychiatric/Behavioral:  Negative for confusion and hallucinations.   See other pertinent positives and negatives in HPI.  Medications Ordered Prior to Encounter[1]  Past Medical History:  Diagnosis Date   Allergy    seasonal   ARTHRALGIA 03/07/2010   Qualifier: Diagnosis of  By: Eyvonne MD, Debby ORN    Arthritis    'mild   Chest tightness 06/27/2020   Chicken pox    Coronary artery disease 09/05/2020   DOE (dyspnea on exertion) 06/27/2020   Headache    Hyperlipidemia    Left bundle branch block    Left bundle branch block (LBBB) 12/29/2016   Mixed dyslipidemia 10/28/2019   Non-traumatic mid back pain 04/24/2016   Nonsustained ventricular tachycardia (HCC) 09/05/2020   Syncope 11/28/2016   Syncope and collapse 09/17/2020   Third degree heart block (HCC)    Allergies[2]  Social History   Socioeconomic History  Marital status: Married    Spouse name: Not on file   Number of children: 1   Years of education: PhD   Highest education level: Not on file  Occupational History   Occupation: professor of Systems Analyst: [employer]  Tobacco Use   Smoking status: Never   Smokeless tobacco: Never  Vaping Use   Vaping status: Never Used  Substance and Sexual Activity   Alcohol use: Yes     Comment: 3 - 4 times per week    Drug use: No   Sexual activity: Not on file  Other Topics Concern   Not on file  Social History Narrative   Lives with wife and child in a one story home.  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  Education: Phd   Social Drivers of Health   Tobacco Use: Low Risk (02/01/2024)   Patient History    Smoking Tobacco Use: Never    Smokeless Tobacco Use: Never    Passive Exposure: Not on file  Financial Resource Strain: Not on file  Food Insecurity: Not on file  Transportation Needs: Not on file  Physical Activity: Not on file  Stress: Not on file  Social Connections: Not on file  Depression (PHQ2-9): Low Risk (02/01/2024)   Depression (PHQ2-9)    PHQ-2 Score: 0  Alcohol Screen: Not on file  Housing: Not on file  Utilities: Not on file  Health Literacy: Not on file    Today's Vitals   02/01/24 0800  BP: 116/84  Pulse: 71  Resp: 16  Temp: 98.7 F (37.1 C)  TempSrc: Oral  SpO2: 97%  Weight: 225 lb (102.1 kg)  Height: 5' 9 (1.753 m)   Body mass index is 33.23 kg/m.  Physical Exam Vitals and nursing note reviewed. Exam conducted with a chaperone present.  Constitutional:      General: He is not in acute distress.    Appearance: He is well-developed.  HENT:     Head: Normocephalic and atraumatic.     Mouth/Throat:     Mouth: Mucous membranes are moist.     Pharynx: Oropharynx is clear.  Eyes:     Conjunctiva/sclera: Conjunctivae normal.  Cardiovascular:     Rate and Rhythm: Normal rate and regular rhythm.     Heart sounds: No murmur heard. Pulmonary:     Effort: Pulmonary effort is normal. No respiratory distress.     Breath sounds: Normal breath sounds.  Genitourinary:    Rectum: No mass, tenderness, anal fissure or external hemorrhoid.  Lymphadenopathy:     Cervical: No cervical adenopathy.     Lower Body: No right inguinal adenopathy. No left inguinal adenopathy.  Skin:    General: Skin is warm.     Findings:  Rash (Perianal/perineal and inguinal erythema (R>L). No induration or tenderness) present. No erythema.  Neurological:     General: No focal deficit present.     Mental Status: He is alert and oriented to person, place, and time.     Gait: Gait normal.  Psychiatric:        Mood and Affect: Mood and affect normal.    ASSESSMENT AND PLAN:  [name] was seen today for medical management of chronic issues.  Diagnoses and all orders for this visit:  Orders Placed This Encounter  Procedures   Flu vaccine trivalent PF, 6mos and older(Flulaval,Afluria,Fluarix,Fluzone)   Pneumococcal conjugate vaccine 20-valent (Prevnar 20)   Heplisav-B  (HepB-CPG) Vaccine   Ambulatory referral to Immunology  Intertrigo We discussed diagnosis, prognosis, and treatment options. Recommend nystatin  powder twice daily, keep area dry, use antibacterial soap, and wear underwear. For acute episode, avoid scratching and try nystatin -triamcinolone  cream, small amount daily for up to 14 days at a time. Continue adequate hygiene, use wipes instead of toilet paper.  Blood he reports on tissue after defecation most likely from skin after irritation from scratching. Rectal digital examination today negative and last colonoscopy in 06/2021, so I do not think further work up is needed at this time.  -     Nystatin -Triamcinolone ; Apply 1 Application topically 2 (two) times daily as needed.  Dispense: 45 g; Refill: 0 -     Nystatin ; Apply 1 Application topically 2 (two) times daily.  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 No problems when handling fish from store or eating it. Problem is getting worse/more intense. No systemic symptoms associated. He can prevent severe reaction if he washes hands immediately after handling; so strongly recommend doing so. Concerned about this becoming a systemic reaction. Epi pen  sent. Immunology referral placed.  -     Ambulatory referral to Immunology -     EPINEPHrine ; Inject 0.3 mg into the muscle as needed for anaphylaxis.  Dispense: 1 each; Refill: 1  Rash/skin eruption It is not present today. Systemic pruritic rash after using soap at hotels or a different detergent seems to be more like irritation more than allergic reaction,?  Eczema. Recommend avoiding trigger factors. OTC cortisone cream and antihistaminic as needed recommended.  Immunization due -     Flu vaccine trivalent PF, 6mos and older(Flulaval,Afluria,Fluarix,Fluzone) -     Pneumococcal conjugate vaccine 20-valent -     Heplisav-B  (HepB-CPG) Vaccine  I personally spent a total of 36 minutes in the care of the patient today including preparing to see the patient, getting/reviewing separately obtained history, performing a medically appropriate exam/evaluation, counseling and educating, referring and communicating with other health care professionals, and documenting clinical information in the EHR.  Return if symptoms worsen or fail to improve, for keep next appointment.   Virgil Slinger, MD [facility]. [place] [facility].       [1] Current Outpatient Medications on File Prior to Visit  Medication Sig Dispense Refill   Acetaminophen  (TYLENOL  PO) Take 1 tablet by mouth as needed (pain).     aspirin  EC 81 MG tablet Take 1 tablet (81 mg total) by mouth daily. Swallow whole. 90 tablet 3   diphenhydrAMINE (BENADRYL) 25 MG tablet Take 25 mg by mouth daily.     escitalopram  (LEXAPRO ) 10 MG tablet TAKE 1 TABLET BY MOUTH EVERY DAY 90 tablet 2   loratadine (CLARITIN) 10 MG tablet Take 10 mg by mouth daily as needed for allergies.     rosuvastatin  (CRESTOR ) 10 MG tablet TAKE 1 TABLET BY MOUTH EVERY DAY 90 tablet 2   No current facility-administered medications on file prior to visit.  [2] Allergies Allergen Reactions   Cefdinir Rash    Rash groin Mood

## 2024-02-01 NOTE — Patient Instructions (Addendum)
 A few things to remember from today's visit:  Rash of both hands - Plan: Ambulatory referral to Immunology  Immunization due - Plan: Flu vaccine trivalent PF, 6mos and older(Flulaval,Afluria,Fluarix,Fluzone), Pneumococcal conjugate vaccine 20-valent (Prevnar 20), Heplisav-B  (HepB-CPG) Vaccine, CANCELED: Heplisav-B  (HepB-CPG) Vaccine  Intertrigo - Plan: nystatin -triamcinolone  (MYCOLOG II) cream, nystatin  (MYCOSTATIN /NYSTOP ) powder  Rash/skin eruption  Rash around anus: Nystatin  powder, keep areas dry, use wipes, and avoid scratching. Antibacterial soap. Cream with steroids, small amount daily as needed for up to 14 days at the time.  Generalized skin reaction seems irritation, possible eczema. Avoid trigger factors. During episodes you can use Zyrtec 10 mg daily and topical Cortizone cream over the counter.  Hands reaction when fishing could be allergic. Try to modify behavior like wearing gloves. I am sending an epi pen in case you get a generalized anaphylactic reaction.   If you need refills for medications you take chronically, please call your pharmacy. Do not use My Chart to request refills or for acute issues that need immediate attention. If you send a my chart message, it may take a few days to be addressed, specially if I am not in the office.  Please be sure medication list is accurate. If a new problem present, please set up appointment sooner than planned today.

## 2024-03-03 ENCOUNTER — Other Ambulatory Visit: Payer: Self-pay

## 2024-03-03 ENCOUNTER — Encounter: Payer: Self-pay | Admitting: Allergy & Immunology

## 2024-03-03 ENCOUNTER — Ambulatory Visit: Payer: Self-pay | Admitting: Allergy & Immunology

## 2024-03-03 VITALS — BP 124/86 | HR 74 | Temp 98.3°F | Resp 16 | Ht 69.0 in | Wt 219.3 lb

## 2024-03-03 DIAGNOSIS — L272 Dermatitis due to ingested food: Secondary | ICD-10-CM

## 2024-03-03 DIAGNOSIS — I447 Left bundle-branch block, unspecified: Secondary | ICD-10-CM | POA: Diagnosis not present

## 2024-03-03 DIAGNOSIS — L239 Allergic contact dermatitis, unspecified cause: Secondary | ICD-10-CM

## 2024-03-03 NOTE — Progress Notes (Signed)
 "  NEW PATIENT  Date of Service/Encounter:  03/03/24  Consult requested by: [name], [name], MD   Assessment:   Allergic contact dermatitis  Dermatitis due to food   Complicated past medical history including right bundle branch block with pacemaker in place  Biochemistry professor at [employer]   Plan/Recommendations:   1. Allergic contact dermatitis - Your symptoms are consistent with allergic contact dermatitis, which is a delayed type hypersensitivity reaction.  - This is a skin reaction that occurs when you touch or come in close contact with substances to which you are allergic.  - Your skin can become itchy, cracked, red, sore and may even bleed.  - The substances that cause this reaction are referred to as allergens and can be an ingredient in your shampoo, soap, makeup, aftershave, jewelry, medication or your clothing.  - You may also have a reaction to an allergen in your workplace because allergens are common in cleaning supplies, paper and ink, disinfectants, surveyor, quantity and rubber products.  - Triggers are diagnosed using patch testing, which is different than the traditional allergy testing. - This type of testing takes days to react instead of minutes. - The chemicals are placed on a the back on a Monday and then you come in on a Wednesday and a Friday for readings. - During this time, you can take antihistamines like Zyrtec, Allegra, and Claritin. - You cannot shower for 48 hours AFTER the testing is placed, so keep this in mind and plan accordingly. - Our NAC-80 series includes 80 chemicals:   2. Dermatitis due to food - We will defer on testing for seafood since you are eating it without a problem. - Continue to limit the tomato.  - I do not think that traditional allergy testing is going to be useful for you at this point.   3. Return in about 11 days (around 03/14/2024) for [facility] TESTING . You can have the follow up appointment with Dr. Iva or a  Nurse Practicioner (our Nurse Practitioners are excellent and always have Physician oversight!).   This note in its entirety was forwarded to the Provider who requested this consultation.  Subjective:   Joel Lynch is a 54 y.o. male presenting today for evaluation of  Chief Complaint  Patient presents with   Establish Care    He had environment allergy when he was young. Recently, he has allergic to soap in hotel - rash and chemical. He goes fishing a lots and when holds fishing his hands are itchy and redness.     Joel Lynch has a history of the following: Patient Active Problem List   Diagnosis Date Noted   Anxiety disorder, unspecified 03/24/2023   Presence of heart assist device (HCC) 03/24/2023   Routine general medical examination at a health care facility 02/12/2022   Presence of permanent cardiac pacemaker 10/24/2020   Syncope and collapse 09/17/2020   Coronary artery disease 09/05/2020   Nonsustained ventricular tachycardia (HCC) 09/05/2020   Headache 09/04/2020   DOE (dyspnea on exertion) 06/27/2020   Chest tightness 06/27/2020   Allergy    Arthritis    Chicken pox    Left bundle branch block    Mixed dyslipidemia 10/28/2019   Left bundle branch block (LBBB) 12/29/2016   Syncope 11/28/2016   Non-traumatic mid back pain 04/24/2016   Pain in joint 03/07/2010    History obtained from: chart review and patient.  Discussed the use of AI scribe software for clinical note transcription  with the patient and/or guardian, who gave verbal consent to proceed.  [name] was referred by [name], [name], MD.     Joel Lynch is a 54 y.o. male presenting for an evaluation of a rash.  Allergic Rhinitis Symptom History: He did have testing done when he was a child. He had shots at that time for years. He thinks he stopped when he was a teenager. He uses OTC antihistamines. He does not use nose spray. He does not have sinus or ear infections. Last antibiotic use was  around 2-3 years ago, cefdinir. This was for a URI of some sort. He also got a course of prednisone. Of note, he did have a rash when he was on the cefdinir, but it could have been a soap issue and he was not aware of it.   Skin Symptom History: He is getting rashes from cosmetics including soaps and shampoos at hotels. He has issues with all sorts of products including shampoos and deodorants. He used a Gold Bond powder and broke out everywhere. He has been having some issues recently over the past couple of years where he will have burning with playing around with fish when he is taking them off the hook. It does not matter where he is at. There is no change to the season at all.   He has been prescribed nystatin  powder and cream, which has provided some relief. He has not tried hydrocortisone or other topical treatments for his skin reactions. He was recently prescribed an EpiPen , although he has not experienced throat closure or used the EpiPen  yet.  He does report a foul taste with tomatoes - this is distinct from reflux sensation. He just tried to limit his exposure to control the symptoms. He avoids pizza. No food allergies are reported, and he consumes a variety of foods without issue, except for tomatoes, which he finds unpalatable and causes discomfort if consumed in large amounts.   He did have EpiPen  prescribed in December since things were getting worse. He never had the throat closure, but he was prescribed this just to give some reassurance.   He has not really tried anything at all to treat his symptoms. He got some nystatin  powder and cream to treat. She thought it was fungal in the buttocks. He has not tried any topical steroid anywhere.   He does have third degree heart block diagnosed in 2022. He was passing out. He started having these problems in 2018 and it worsened in 2022. He had to be a bit more aggressive with requiring a monitor. He had a pacemaker installed and he has not had  problems at all. He sees Cardiology once per year now.   He is a professor of biochemistry and enjoys fishing, which he does depending on the time of year, ranging from once a week to once a month.   Otherwise, there is no history of other atopic diseases, including asthma, food allergies, drug allergies, stinging insect allergies, or contact dermatitis. There is no significant infectious history. Vaccinations are up to date.    Past Medical History: Patient Active Problem List   Diagnosis Date Noted   Anxiety disorder, unspecified 03/24/2023   Presence of heart assist device (HCC) 03/24/2023   Routine general medical examination at a health care facility 02/12/2022   Presence of permanent cardiac pacemaker 10/24/2020   Syncope and collapse 09/17/2020   Coronary artery disease 09/05/2020   Nonsustained ventricular tachycardia (HCC) 09/05/2020   Headache 09/04/2020  DOE (dyspnea on exertion) 06/27/2020   Chest tightness 06/27/2020   Allergy    Arthritis    Chicken pox    Left bundle branch block    Mixed dyslipidemia 10/28/2019   Left bundle branch block (LBBB) 12/29/2016   Syncope 11/28/2016   Non-traumatic mid back pain 04/24/2016   Pain in joint 03/07/2010    Medication List:  Allergies as of 03/03/2024       Reactions   Cefdinir Rash   Rash groin Mood        Medication List        Accurate as of March 03, 2024 12:05 PM. If you have any questions, ask your nurse or doctor.          aspirin  EC 81 MG tablet Take 1 tablet (81 mg total) by mouth daily. Swallow whole.   diphenhydrAMINE 25 MG tablet Commonly known as: BENADRYL Take 25 mg by mouth daily.   EPINEPHrine  0.3 mg/0.3 mL Soaj injection Commonly known as: EpiPen  2-Pak Inject 0.3 mg into the muscle as needed for anaphylaxis.   escitalopram  10 MG tablet Commonly known as: LEXAPRO  TAKE 1 TABLET BY MOUTH EVERY DAY   loratadine 10 MG tablet Commonly known as: CLARITIN Take 10 mg by mouth daily as  needed for allergies.   nystatin  powder Commonly known as: MYCOSTATIN /NYSTOP  Apply 1 Application topically 2 (two) times daily.   nystatin -triamcinolone  cream Commonly known as: MYCOLOG II Apply 1 Application topically 2 (two) times daily as needed.   rosuvastatin  10 MG tablet Commonly known as: CRESTOR  TAKE 1 TABLET BY MOUTH EVERY DAY   TYLENOL  PO Take 1 tablet by mouth as needed (pain).        Birth History: non-contributory  Developmental History: non-contributory  Past Surgical History: Past Surgical History:  Procedure Laterality Date   PACEMAKER IMPLANT N/A 09/18/2020   Procedure: PACEMAKER IMPLANT;  Surgeon: Kelsie Agent, MD;  Location: [facility];  Service: Cardiovascular;  Laterality: N/A;   WISDOM TOOTH EXTRACTION     WRIST SURGERY  02/17/2001   broken hand/wrist     Family History: Family History  Problem Relation Age of Onset   Colon polyps Mother    Hyperlipidemia Mother    Diabetes Mother    Arthritis Father    Cancer Father 4       hodgkins   Stroke Maternal Grandmother    Arthritis Paternal Grandmother    Hypertension Paternal Grandmother    Heart disease Paternal Grandmother    Colon cancer Neg Hx    Crohn's disease Neg Hx    Esophageal cancer Neg Hx    Rectal cancer Neg Hx    Stomach cancer Neg Hx      Social History: Shahzad lives at home with his family. They live in a house that is 53 years old.  There are hardwood throughout the home.  They have a heat pump for heating and central cooling with fans.  There are 2 dogs and 1 cat inside of the home.  There are dust mite covers on the bed, but not the pillows.  There is no tobacco exposure.  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  He enjoys teaching more than research, but he does do some research as well.  There is exposure to fumes, chemicals, and dust.  There is no HEPA filter in the home.  They do not  [place] in interstate  or industrial area.   Review of systems otherwise negative other than that mentioned in the HPI.    Objective:   Blood pressure 124/86, pulse 74, temperature 98.3 F (36.8 C), temperature source Temporal, resp. rate 16, height 5' 9 (1.753 m), weight 219 lb 4.8 oz (99.5 kg), SpO2 93%. Body mass index is 32.38 kg/m.     Physical Exam Vitals reviewed.  Constitutional:      Appearance: He is well-developed.  HENT:     Head: Normocephalic and atraumatic.     Right Ear: Tympanic membrane, ear canal and external ear normal. No drainage, swelling or tenderness. Tympanic membrane is not injected, scarred, erythematous, retracted or bulging.     Left Ear: Tympanic membrane, ear canal and external ear normal. No drainage, swelling or tenderness. Tympanic membrane is not injected, scarred, erythematous, retracted or bulging.     Nose: No nasal deformity, septal deviation, mucosal edema or rhinorrhea.     Right Turbinates: Enlarged, swollen and pale.     Left Turbinates: Enlarged, swollen and pale.     Right Sinus: No maxillary sinus tenderness or frontal sinus tenderness.     Left Sinus: No maxillary sinus tenderness or frontal sinus tenderness.     Mouth/Throat:     Mouth: Mucous membranes are not pale and not dry.     Pharynx: Uvula midline.  Eyes:     General:        Right eye: No discharge.        Left eye: No discharge.     Conjunctiva/sclera: Conjunctivae normal.     Right eye: Right conjunctiva is not injected. No chemosis.    Left eye: Left conjunctiva is not injected. No chemosis.    Pupils: Pupils are equal, round, and reactive to light.  Cardiovascular:     Rate and Rhythm: Normal rate and regular rhythm.     Heart sounds: Normal heart sounds.  Pulmonary:     Effort: Pulmonary effort is normal. No tachypnea, accessory muscle usage or respiratory distress.     Breath sounds: Normal breath sounds. No wheezing, rhonchi or rales.     Comments: Moving  air well in all lung fields. Chest:     Chest wall: No tenderness.  Abdominal:     Tenderness: There is no abdominal tenderness. There is no guarding or rebound.  Lymphadenopathy:     Head:     Right side of head: No submandibular, tonsillar or occipital adenopathy.     Left side of head: No submandibular, tonsillar or occipital adenopathy.     Cervical: No cervical adenopathy.  Skin:    General: Skin is warm.     Capillary Refill: Capillary refill takes less than 2 seconds.     Coloration: Skin is not pale.     Findings: No abrasion, erythema, petechiae or rash. Rash is not papular, urticarial or vesicular.     Comments: No rashes today.  Neurological:     Mental Status: He is alert.  Psychiatric:        Behavior: Behavior is cooperative.      Diagnostic studies: none          Marty Shaggy, MD [facility]       "

## 2024-03-03 NOTE — Patient Instructions (Addendum)
 1. Allergic contact dermatitis - Your symptoms are consistent with allergic contact dermatitis, which is a delayed type hypersensitivity reaction.  - This is a skin reaction that occurs when you touch or come in close contact with substances to which you are allergic.  - Your skin can become itchy, cracked, red, sore and may even bleed.  - The substances that cause this reaction are referred to as allergens and can be an ingredient in your shampoo, soap, makeup, aftershave, jewelry, medication or your clothing.  - You may also have a reaction to an allergen in your workplace because allergens are common in cleaning supplies, paper and ink, disinfectants, surveyor, quantity and rubber products.  - Triggers are diagnosed using patch testing, which is different than the traditional allergy testing. - This type of testing takes days to react instead of minutes. - The chemicals are placed on a the back on a Monday and then you come in on a Wednesday and a Friday for readings. - During this time, you can take antihistamines like Zyrtec, Allegra, and Claritin. - You cannot shower for 48 hours AFTER the testing is placed, so keep this in mind and plan accordingly. - Our NAC-80 series includes the following chemicals:      Learn more about patch testing by scanning the code below:   2. Dermatitis due to food - We will defer on testing for seafood since you are eating it without a problem. - Continue to limit the tomato.  - I do not think that traditional allergy testing is going to be useful for you at this point.   3. Return in about 11 days (around 03/14/2024) for [facility] TEST[facility] . You can have the follow up appointment with Dr. Iva or a Nurse Practicioner (our Nurse Practitioners are excellent and always have Physician oversight!).    Please inform us  of any Emergency Department visits, hospitalizations, or changes in symptoms. Call us  before going to the ED for breathing or allergy  symptoms since we might be able to fit you in for a sick visit. Feel free to contact us  anytime with any questions, problems, or concerns.  It was a pleasure to meet you today!  Websites that have reliable patient information: 1. American Academy of Asthma, Allergy, and Immunology: www.aaaai.org 2. Food Allergy Research and Education (FARE): foodallergy.org 3. Mothers of Asthmatics: http://www.asthmacommunitynetwork.org 4. American College of Allergy, Asthma, and Immunology: www.acaai.org      Like us  on [employer] and Instagram for our latest updates!      A healthy democracy works best when [employer] participate! Make sure you are registered to vote! If you have moved or changed any of your contact information, you will need to get this updated before voting! Scan the QR codes below to learn more!

## 2024-03-16 ENCOUNTER — Ambulatory Visit

## 2024-03-21 ENCOUNTER — Encounter: Admitting: Family

## 2024-03-23 ENCOUNTER — Encounter: Admitting: Family

## 2024-03-25 ENCOUNTER — Encounter: Admitting: Family Medicine

## 2024-03-30 ENCOUNTER — Ambulatory Visit

## 2024-05-02 ENCOUNTER — Encounter: Admitting: Family

## 2024-05-04 ENCOUNTER — Encounter: Admitting: Family

## 2024-05-06 ENCOUNTER — Encounter: Admitting: Internal Medicine

## 2024-06-15 ENCOUNTER — Ambulatory Visit

## 2024-06-29 ENCOUNTER — Ambulatory Visit

## 2024-09-14 ENCOUNTER — Ambulatory Visit

## 2024-09-28 ENCOUNTER — Ambulatory Visit

## 2024-12-14 ENCOUNTER — Ambulatory Visit

## 2024-12-28 ENCOUNTER — Ambulatory Visit

## 2025-03-15 ENCOUNTER — Ambulatory Visit
